# Patient Record
Sex: Female | Born: 1975 | Race: White | Hispanic: No | Marital: Single | Smoking: Former smoker
Health system: Southern US, Community
[De-identification: ages and names within clinical notes are randomized; demographics above are authoritative.]

## PROBLEM LIST (undated history)

## (undated) DIAGNOSIS — IMO0001 Reserved for inherently not codable concepts without codable children: Secondary | ICD-10-CM

## (undated) DIAGNOSIS — N870 Mild cervical dysplasia: Secondary | ICD-10-CM

## (undated) DIAGNOSIS — B977 Papillomavirus as the cause of diseases classified elsewhere: Secondary | ICD-10-CM

## (undated) HISTORY — DX: Mild cervical dysplasia: N87.0

## (undated) HISTORY — DX: Papillomavirus as the cause of diseases classified elsewhere: B97.7

## (undated) HISTORY — DX: Reserved for inherently not codable concepts without codable children: IMO0001

---
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
## 2003-10-13 ENCOUNTER — Encounter: Admission: RE | Admit: 2003-10-13 | Discharge: 2003-10-13 | Payer: Self-pay | Admitting: Endocrinology

## 2008-07-15 ENCOUNTER — Ambulatory Visit: Payer: Self-pay | Admitting: Obstetrics and Gynecology

## 2008-07-15 ENCOUNTER — Encounter: Payer: Self-pay | Admitting: Obstetrics and Gynecology

## 2008-07-15 ENCOUNTER — Other Ambulatory Visit: Admission: RE | Admit: 2008-07-15 | Discharge: 2008-07-15 | Payer: Self-pay | Admitting: Obstetrics and Gynecology

## 2009-08-29 ENCOUNTER — Ambulatory Visit: Payer: Self-pay | Admitting: Obstetrics and Gynecology

## 2009-08-29 ENCOUNTER — Other Ambulatory Visit: Admission: RE | Admit: 2009-08-29 | Discharge: 2009-08-29 | Payer: Self-pay | Admitting: Obstetrics and Gynecology

## 2009-08-29 ENCOUNTER — Encounter: Payer: Self-pay | Admitting: Obstetrics and Gynecology

## 2010-01-06 ENCOUNTER — Ambulatory Visit: Payer: Self-pay | Admitting: Obstetrics and Gynecology

## 2010-11-14 ENCOUNTER — Other Ambulatory Visit
Admission: RE | Admit: 2010-11-14 | Discharge: 2010-11-14 | Payer: Self-pay | Source: Home / Self Care | Admitting: Obstetrics and Gynecology

## 2010-11-14 ENCOUNTER — Ambulatory Visit
Admission: RE | Admit: 2010-11-14 | Discharge: 2010-11-14 | Payer: Self-pay | Source: Home / Self Care | Attending: Obstetrics and Gynecology | Admitting: Obstetrics and Gynecology

## 2010-12-04 ENCOUNTER — Ambulatory Visit
Admission: RE | Admit: 2010-12-04 | Discharge: 2010-12-04 | Payer: Self-pay | Source: Home / Self Care | Attending: Obstetrics and Gynecology | Admitting: Obstetrics and Gynecology

## 2010-12-04 DIAGNOSIS — IMO0001 Reserved for inherently not codable concepts without codable children: Secondary | ICD-10-CM

## 2010-12-04 HISTORY — DX: Reserved for inherently not codable concepts without codable children: IMO0001

## 2011-01-08 ENCOUNTER — Ambulatory Visit: Payer: Self-pay | Admitting: Obstetrics and Gynecology

## 2011-01-15 ENCOUNTER — Ambulatory Visit (INDEPENDENT_AMBULATORY_CARE_PROVIDER_SITE_OTHER): Payer: Managed Care, Other (non HMO) | Admitting: Obstetrics and Gynecology

## 2011-01-15 DIAGNOSIS — N949 Unspecified condition associated with female genital organs and menstrual cycle: Secondary | ICD-10-CM

## 2011-01-15 DIAGNOSIS — N946 Dysmenorrhea, unspecified: Secondary | ICD-10-CM

## 2011-01-15 DIAGNOSIS — N925 Other specified irregular menstruation: Secondary | ICD-10-CM

## 2011-05-28 ENCOUNTER — Telehealth (INDEPENDENT_AMBULATORY_CARE_PROVIDER_SITE_OTHER): Payer: Managed Care, Other (non HMO) | Admitting: Obstetrics and Gynecology

## 2011-05-28 DIAGNOSIS — L659 Nonscarring hair loss, unspecified: Secondary | ICD-10-CM

## 2011-05-28 NOTE — Telephone Encounter (Addendum)
PATIENT STATES HAIR LOSS FOR 3 MONTHS AND FAMILY HISTORY OF HYPOTHYROID & HYPERTHYROIDISM. REQUESTING THYROID LAB ORDER & ANY OTHER LABS RELATED TO HAIR LOSS.

## 2011-05-29 ENCOUNTER — Other Ambulatory Visit: Payer: Self-pay | Admitting: *Deleted

## 2011-06-01 ENCOUNTER — Telehealth: Payer: Self-pay

## 2011-06-01 NOTE — Telephone Encounter (Signed)
PT NOTIFIED ALL THYROID TESTS NORMAL & FOLLOW UP WITH DERMATOLOGIST PER DR. GOTTSEGEN.

## 2011-06-01 NOTE — Telephone Encounter (Signed)
PER DR. GOTTSEGENS NOTE PT. NOTIFIED ALL THYROID TESTS ARE NORMAL-LEFT INFO ON CELL # [email or web address].

## 2011-12-31 DIAGNOSIS — IMO0001 Reserved for inherently not codable concepts without codable children: Secondary | ICD-10-CM | POA: Insufficient documentation

## 2012-01-08 ENCOUNTER — Encounter: Payer: Managed Care, Other (non HMO) | Admitting: Obstetrics and Gynecology

## 2012-01-24 ENCOUNTER — Encounter: Payer: Self-pay | Admitting: Obstetrics and Gynecology

## 2012-01-24 ENCOUNTER — Other Ambulatory Visit (HOSPITAL_COMMUNITY)
Admission: RE | Admit: 2012-01-24 | Discharge: 2012-01-24 | Disposition: A | Discharge status: Home / Self Care | Payer: Managed Care, Other (non HMO) | Source: Ambulatory Visit | Attending: Obstetrics and Gynecology | Admitting: Obstetrics and Gynecology

## 2012-01-24 ENCOUNTER — Ambulatory Visit (INDEPENDENT_AMBULATORY_CARE_PROVIDER_SITE_OTHER): Payer: Managed Care, Other (non HMO) | Admitting: Obstetrics and Gynecology

## 2012-01-24 VITALS — BP 110/60 | Ht 66.0 in | Wt 145.0 lb

## 2012-01-24 DIAGNOSIS — Z01419 Encounter for gynecological examination (general) (routine) without abnormal findings: Secondary | ICD-10-CM | POA: Insufficient documentation

## 2012-01-24 DIAGNOSIS — N870 Mild cervical dysplasia: Secondary | ICD-10-CM | POA: Insufficient documentation

## 2012-01-24 DIAGNOSIS — B977 Papillomavirus as the cause of diseases classified elsewhere: Secondary | ICD-10-CM | POA: Insufficient documentation

## 2012-01-24 LAB — LIPID PANEL
Cholesterol: 170 mg/dL (ref 0–200)
HDL: 61 mg/dL (ref >39)
LDL Cholesterol: 97 mg/dL (ref 0–99)
Total CHOL/HDL Ratio: 2.8 Ratio
Triglycerides: 60 mg/dL (ref <150)
VLDL: 12 mg/dL (ref 0–40)

## 2012-01-24 NOTE — Progress Notes (Signed)
The patient came to see me today for her annual GYN exam. She is happy with her IUD. She has very light cycles. She is not currently sexually active. She had elevated triglycerides last year and she has been working on diet and exercise. She fasted  today to be rechecked. She is having no pelvic pain.  Physical examination: Kennon Portela present. HEENT within normal limits. Neck: Thyroid not large. No masses. Supraclavicular nodes: not enlarged. Breasts: Examined in both sitting and lying  position. No skin changes and no masses. Abdomen: Soft no guarding rebound or masses or hernia. Pelvic: External: Within normal limits. BUS: Within normal limits. Vaginal:within normal limits. Good estrogen effect. No evidence of cystocele rectocele or enterocele. Cervix: clean. IUD string visible Uterus: Normal size and shape. Adnexa: No masses. Rectovaginal exam: Confirmatory and negative. Extremities: Within normal limits.  Assessment: Normal GYN exam. Elevated triglycerides.  Plan: Lab work done.

## 2012-01-25 ENCOUNTER — Other Ambulatory Visit: Payer: Self-pay | Admitting: *Deleted

## 2012-01-25 DIAGNOSIS — D729 Disorder of white blood cells, unspecified: Secondary | ICD-10-CM

## 2012-01-25 LAB — URINALYSIS W MICROSCOPIC + REFLEX CULTURE
Bacteria, UA: NONE SEEN
Bilirubin Urine: NEGATIVE
Casts: NONE SEEN
Crystals: NONE SEEN
Glucose, UA: NEGATIVE mg/dL
Hgb urine dipstick: NEGATIVE
Ketones, ur: NEGATIVE mg/dL
Leukocytes, UA: NEGATIVE
Nitrite: NEGATIVE
Protein, ur: NEGATIVE mg/dL
Specific Gravity, Urine: 1.007 (ref 1.005–1.030)
Squamous Epithelial / HPF: NONE SEEN
Urobilinogen, UA: 0.2 mg/dL (ref 0.0–1.0)
pH: 7 (ref 5.0–8.0)

## 2012-01-25 LAB — CBC WITH DIFFERENTIAL/PLATELET
Basophils Absolute: 0 10*3/uL (ref 0.0–0.1)
Basophils Relative: 0 % (ref 0–1)
Eosinophils Absolute: 0.2 10*3/uL (ref 0.0–0.7)
Eosinophils Relative: 2 % (ref 0–5)
HCT: 43.3 % (ref 36.0–46.0)
Hemoglobin: 14.4 g/dL (ref 12.0–15.0)
Lymphocytes Relative: 35 % (ref 12–46)
Lymphs Abs: 4 10*3/uL (ref 0.7–4.0)
MCH: 31 pg (ref 26.0–34.0)
MCHC: 33.3 g/dL (ref 30.0–36.0)
MCV: 93.3 fL (ref 78.0–100.0)
Monocytes Absolute: 0.8 10*3/uL (ref 0.1–1.0)
Monocytes Relative: 7 % (ref 3–12)
Neutro Abs: 6.5 10*3/uL (ref 1.7–7.7)
Neutrophils Relative %: 56 % (ref 43–77)
Platelets: 324 10*3/uL (ref 150–400)
RBC: 4.64 MIL/uL (ref 3.87–5.11)
RDW: 13.4 % (ref 11.5–15.5)
WBC: 11.5 10*3/uL — ABNORMAL HIGH (ref 4.0–10.5)

## 2012-03-11 ENCOUNTER — Other Ambulatory Visit: Payer: Managed Care, Other (non HMO)

## 2012-03-14 ENCOUNTER — Other Ambulatory Visit: Payer: Managed Care, Other (non HMO)

## 2013-03-06 ENCOUNTER — Ambulatory Visit (INDEPENDENT_AMBULATORY_CARE_PROVIDER_SITE_OTHER): Payer: Managed Care, Other (non HMO) | Admitting: Family

## 2013-03-06 ENCOUNTER — Encounter: Payer: Self-pay | Admitting: Family

## 2013-03-06 VITALS — BP 110/80 | HR 60 | Temp 97.8°F | Resp 16 | Ht 66.75 in | Wt 150.1 lb

## 2013-03-06 DIAGNOSIS — R195 Other fecal abnormalities: Secondary | ICD-10-CM

## 2013-03-06 DIAGNOSIS — Z8742 Personal history of other diseases of the female genital tract: Secondary | ICD-10-CM

## 2013-03-06 DIAGNOSIS — Z87898 Personal history of other specified conditions: Secondary | ICD-10-CM

## 2013-03-06 DIAGNOSIS — N870 Mild cervical dysplasia: Secondary | ICD-10-CM

## 2013-03-06 NOTE — Assessment & Plan Note (Signed)
Hx of abnormal pap. Her last GYN retired, she requests referral to GYN in [place].

## 2013-03-06 NOTE — Patient Instructions (Addendum)
Please schedule a physical at the front desk. Come fasting to this appointment. You will be contacted about your referral to GYN.  Please let us know if you have not heard back within 1 week about your referral. Welcome to [facility]!

## 2013-03-06 NOTE — Progress Notes (Signed)
Subjective:    Patient ID: Nancy Becker, female    DOB: Aug 04, 1976, 37 y.o.   MRN: [id number]  HPI   [name] is a 37 yr old female who presents today to establish care.  She has two concerns today.  1) Neck fullness- notes that the right side of her posterior neck is protruding more than the left for the last 3 months.  2) Floating stool- reports that for the last 6-8 weeks ago month her stools have been "floating." Denies black, bloody or greasy stools. Denies fever or abdominal pain. She has been trying to lose weight recently. She has changed her diet x 1 month.  3) Requesting referral to GYN- has Hx of HPV and CIN 1 pap- pt reports that this was about 4 years ago.  She has an IUD which is 37 years old.     Review of Systems  Constitutional: Negative for unexpected weight change.  HENT: Negative for congestion.   Eyes: Negative for visual disturbance.  Respiratory: Negative for cough.   Cardiovascular: Negative for leg swelling.  Gastrointestinal: Negative for vomiting, diarrhea, constipation and blood in stool.  Genitourinary: Negative for dysuria and frequency.  Musculoskeletal: Negative for myalgias and arthralgias.  Skin: Negative for rash.  Neurological: Negative for headaches.  Hematological: Negative for adenopathy.  Psychiatric/Behavioral:       Denies depression/anxiety       Past Medical History  Diagnosis Date  . IUD 12/04/2010    Mirena inserted.  Marland Kitchen HPV (human papilloma virus) infection   . CIN I (cervical intraepithelial neoplasia I)     History   Social History  . Marital Status: Single    Spouse Name: N/A    Number of Children: 0  . Years of Education: N/A   Occupational History  . SOFTWARE DEVELOPER    Social History Main Topics  . Smoking status: Former Smoker -- 0.50 packs/day for 20 years    Types: Cigarettes    Quit date: 09/06/2012  . Smokeless tobacco: Never Used  . Alcohol Use: 1.0 oz/week    2 drink(s) per week   Comment: occasional  . Drug Use: No  . Sexually Active: Yes    Birth Control/ Protection: IUD     Comment: Mirena inserted 12-04-10   Other Topics Concern  . Not on file   Social History Narrative   Divorced   No children   Works as a Systems analyst   No pets   Enjoys running, knitting, sewing   Quit smoking 2013    Past Surgical History  Procedure Laterality Date  . Tympanoplasty  1987    Family History  Problem Relation Age of Onset  . Hypertension Father   . Heart disease Maternal Grandmother   . Heart disease Maternal Grandfather     heart attack  . Hyperthyroidism Maternal Grandfather   . Hypothyroidism Mother   . Hyperthyroidism Maternal Aunt   . Cancer Paternal Grandfather     lung    No Known Allergies  Current Outpatient Prescriptions on File Prior to Visit  Medication Sig Dispense Refill  . levonorgestrel (MIRENA) 20 MCG/24HR IUD 1 each by Intrauterine route once. Inserted 12/04/10.       No current facility-administered medications on file prior to visit.    BP 110/80  Pulse 60  Temp(Src) 97.8 F (36.6 C) (Oral)  Resp 16  Ht 5' 6.75" (1.695 m)  Wt 150 lb 1.9 oz (68.094 kg)  BMI 23.7 kg/m2  SpO2  99%    Objective:   Physical Exam  Constitutional: She is oriented to person, place, and time. She appears well-developed and well-nourished. No distress.  HENT:  Head: Normocephalic and atraumatic.  Neck: No thyromegaly present.  Cardiovascular: Normal rate and regular rhythm.   No murmur heard. Pulmonary/Chest: Effort normal and breath sounds normal. No respiratory distress. She has no wheezes. She has no rales. She exhibits no tenderness.  Abdominal: Soft. Bowel sounds are normal. She exhibits no distension and no mass. There is no tenderness. There is no rebound and no guarding.  Musculoskeletal: She exhibits no edema.  No neck asymmetry noted anterior or posteriorly. No tenderness or mass.   Lymphadenopathy:    She has no cervical  adenopathy.  Neurological: She is alert and oriented to person, place, and time.  Psychiatric: She has a normal mood and affect. Her behavior is normal. Judgment and thought content normal.          Assessment & Plan:  "Neck Fullness" - not appreciated on exam, reassurance provided,  Could have been due to muscle spasm?

## 2013-03-06 NOTE — Assessment & Plan Note (Signed)
Pt with "floating stools" in the absence of any other sign of infection or abnormal fat content.  Reassurance provided that this is likely dietary related.

## 2013-04-02 ENCOUNTER — Ambulatory Visit: Payer: Managed Care, Other (non HMO) | Admitting: Family Medicine

## 2013-04-09 LAB — HM PAP SMEAR: HM Pap smear: NORMAL

## 2013-04-10 ENCOUNTER — Encounter: Payer: Self-pay | Admitting: Family

## 2013-04-10 ENCOUNTER — Ambulatory Visit (INDEPENDENT_AMBULATORY_CARE_PROVIDER_SITE_OTHER): Payer: Managed Care, Other (non HMO) | Admitting: Family

## 2013-04-10 VITALS — BP 100/70 | HR 56 | Temp 97.8°F | Resp 16 | Ht 66.5 in | Wt 148.1 lb

## 2013-04-10 DIAGNOSIS — Z23 Encounter for immunization: Secondary | ICD-10-CM

## 2013-04-10 DIAGNOSIS — R195 Other fecal abnormalities: Secondary | ICD-10-CM

## 2013-04-10 DIAGNOSIS — Z0000 Encounter for general adult medical examination without abnormal findings: Secondary | ICD-10-CM

## 2013-04-10 LAB — CBC WITH DIFFERENTIAL/PLATELET
Basophils Absolute: 0.1 10*3/uL (ref 0.0–0.1)
Basophils Relative: 1 % (ref 0–1)
Eosinophils Absolute: 0.2 10*3/uL (ref 0.0–0.7)
Eosinophils Relative: 2 % (ref 0–5)
HCT: 40.7 % (ref 36.0–46.0)
Hemoglobin: 13.6 g/dL (ref 12.0–15.0)
Lymphocytes Relative: 37 % (ref 12–46)
Lymphs Abs: 3.1 10*3/uL (ref 0.7–4.0)
MCH: 30.6 pg (ref 26.0–34.0)
MCHC: 33.4 g/dL (ref 30.0–36.0)
MCV: 91.7 fL (ref 78.0–100.0)
Monocytes Absolute: 0.7 10*3/uL (ref 0.1–1.0)
Monocytes Relative: 9 % (ref 3–12)
Neutro Abs: 4.3 10*3/uL (ref 1.7–7.7)
Neutrophils Relative %: 51 % (ref 43–77)
Platelets: 306 10*3/uL (ref 150–400)
RBC: 4.44 MIL/uL (ref 3.87–5.11)
RDW: 14.2 % (ref 11.5–15.5)
WBC: 8.4 10*3/uL (ref 4.0–10.5)

## 2013-04-10 LAB — HEPATIC FUNCTION PANEL
ALT: 22 U/L (ref 0–35)
AST: 20 U/L (ref 0–37)
Albumin: 4.7 g/dL (ref 3.5–5.2)
Alkaline Phosphatase: 36 U/L — ABNORMAL LOW (ref 39–117)
Bilirubin, Direct: 0.2 mg/dL (ref 0.0–0.3)
Indirect Bilirubin: 0.5 mg/dL (ref 0.0–0.9)
Total Bilirubin: 0.7 mg/dL (ref 0.3–1.2)
Total Protein: 6.8 g/dL (ref 6.0–8.3)

## 2013-04-10 LAB — BASIC METABOLIC PANEL WITH GFR
BUN: 13 mg/dL (ref 6–23)
CO2: 27 mEq/L (ref 19–32)
Calcium: 9.6 mg/dL (ref 8.4–10.5)
Chloride: 105 mEq/L (ref 96–112)
Creat: 0.68 mg/dL (ref 0.50–1.10)
GFR, Est African American: >89 mL/min
GFR, Est Non African American: >89 mL/min
Glucose, Bld: 92 mg/dL (ref 70–99)
Potassium: 4.3 mEq/L (ref 3.5–5.3)
Sodium: 139 mEq/L (ref 135–145)

## 2013-04-10 LAB — LIPID PANEL
Cholesterol: 161 mg/dL (ref 0–200)
HDL: 57 mg/dL (ref >39)
LDL Cholesterol: 93 mg/dL (ref 0–99)
Total CHOL/HDL Ratio: 2.8 Ratio
Triglycerides: 54 mg/dL (ref <150)
VLDL: 11 mg/dL (ref 0–40)

## 2013-04-10 LAB — TSH: TSH: 0.871 u[IU]/mL (ref 0.350–4.500)

## 2013-04-10 NOTE — Progress Notes (Signed)
Subjective:    Patient ID: Nancy Becker, female    DOB: Apr 28, 1976, 37 y.o.   MRN: [id number]  HPI  Nancy Becker is a 37 yr old female who presents today for cpx.  Patient presents today for complete physical.  Immunizations: tetanus due Diet: healthy diet Exercise: runs/walks 3-5 days a week sometimes 2 x a day.   Pap Smear: up to date  Floating stools- she continues to be concerned about this and would like further evaluation.  Review of Systems  Constitutional: Negative for unexpected weight change.  HENT: Negative for hearing loss and congestion.   Eyes: Negative for visual disturbance.  Respiratory: Negative for cough.   Cardiovascular: Negative for chest pain.  Gastrointestinal: Negative for nausea, vomiting and diarrhea.  Genitourinary: Negative for menstrual problem.  Musculoskeletal: Negative for myalgias and arthralgias.  Skin: Negative for rash.  Neurological: Negative for headaches.  Hematological: Negative for adenopathy.  Psychiatric/Behavioral:       Denies depression/anxiety   Past Medical History  Diagnosis Date  . IUD 12/04/2010    Mirena inserted.  Marland Kitchen HPV (human papilloma virus) infection   . CIN I (cervical intraepithelial neoplasia I)     History   Social History  . Marital Status: Single    Spouse Name: N/A    Number of Children: 0  . Years of Education: N/A   Occupational History  . SOFTWARE DEVELOPER    Social History Main Topics  . Smoking status: Former Smoker -- 0.50 packs/day for 20 years    Types: Cigarettes    Quit date: 09/06/2012  . Smokeless tobacco: Never Used  . Alcohol Use: 1.0 oz/week    2 drink(s) per week     Comment: occasional  . Drug Use: No  . Sexually Active: Yes    Birth Control/ Protection: IUD     Comment: Mirena inserted 12-04-10   Other Topics Concern  . Not on file   Social History Narrative   Divorced   No children   Works as a Systems analyst   No pets   Enjoys running, knitting,  sewing   Quit smoking 2013    Past Surgical History  Procedure Laterality Date  . Tympanoplasty  1987    Family History  Problem Relation Age of Onset  . Hypertension Father   . Heart disease Maternal Grandmother   . Heart disease Maternal Grandfather     heart attack  . Hyperthyroidism Maternal Grandfather   . Other Maternal Grandfather     Vitamin B deficiency  . Hypothyroidism Mother   . Other Mother     vitamin B deficiency  . Hyperthyroidism Maternal Aunt   . Cancer Paternal Grandfather     lung    No Known Allergies  Current Outpatient Prescriptions on File Prior to Visit  Medication Sig Dispense Refill  . levonorgestrel (MIRENA) 20 MCG/24HR IUD 1 each by Intrauterine route once. Inserted 12/04/10.       No current facility-administered medications on file prior to visit.    BP 100/70  Pulse 56  Temp(Src) 97.8 F (36.6 C) (Oral)  Resp 16  Ht 5' 6.5" (1.689 m)  Wt 148 lb 1.3 oz (67.169 kg)  BMI 23.55 kg/m2  SpO2 99%       Objective:   Physical Exam  Physical Exam  Constitutional: She is oriented to person, place, and time. She appears well-developed and well-nourished. No distress.  HENT:  Head: Normocephalic and atraumatic.  Right Ear: Tympanic membrane and  ear canal normal.  Left Ear: Tympanic membrane and ear canal normal.  Mouth/Throat: Oropharynx is clear and moist.  Eyes: Pupils are equal, round, and reactive to light. No scleral icterus.  Neck: Normal range of motion. No thyromegaly present.  Cardiovascular: Normal rate and regular rhythm.   No murmur heard. Pulmonary/Chest: Effort normal and breath sounds normal. No respiratory distress. He has no wheezes. She has no rales. She exhibits no tenderness.  Abdominal: Soft. Bowel sounds are normal. He exhibits no distension and no mass. There is no tenderness. There is no rebound and no guarding.  Musculoskeletal: She exhibits no edema.  Lymphadenopathy:    She has no cervical adenopathy.   Neurological: She is alert and oriented to person, place, and time.  She exhibits normal muscle tone. Coordination normal.  Skin: Skin is warm and dry.  Psychiatric: She has a normal mood and affect. Her behavior is normal. Judgment and thought content normal.  Breast/pelvic- deferred to GYN          Assessment & Plan:         Assessment & Plan:

## 2013-04-10 NOTE — Assessment & Plan Note (Signed)
She would like further evaluation, refer to GI.

## 2013-04-10 NOTE — Assessment & Plan Note (Signed)
Continue healthy diet, exercise.  Tdap today. She will be travelling to [place] in the fall. We discussed twinrix- she will think about it.

## 2013-04-10 NOTE — Patient Instructions (Addendum)
Please complete your lab work prior to leaving. Follow up in 1 year, sooner if problems/concerns.  

## 2013-04-11 LAB — URINALYSIS, ROUTINE W REFLEX MICROSCOPIC
Bilirubin Urine: NEGATIVE
Glucose, UA: NEGATIVE mg/dL
Hgb urine dipstick: NEGATIVE
Ketones, ur: NEGATIVE mg/dL
Leukocytes, UA: NEGATIVE
Nitrite: NEGATIVE
Protein, ur: NEGATIVE mg/dL
Specific Gravity, Urine: 1.005 (ref 1.005–1.030)
Urobilinogen, UA: 0.2 mg/dL (ref 0.0–1.0)
pH: 7 (ref 5.0–8.0)

## 2013-04-12 ENCOUNTER — Encounter: Payer: Self-pay | Admitting: Family

## 2013-09-10 ENCOUNTER — Other Ambulatory Visit: Payer: Self-pay

## 2014-04-12 ENCOUNTER — Ambulatory Visit (INDEPENDENT_AMBULATORY_CARE_PROVIDER_SITE_OTHER): Payer: Managed Care, Other (non HMO) | Admitting: Family

## 2014-04-12 ENCOUNTER — Encounter: Payer: Self-pay | Admitting: Family

## 2014-04-12 VITALS — BP 118/78 | HR 50 | Temp 97.7°F | Resp 16 | Ht 66.0 in | Wt 159.1 lb

## 2014-04-12 DIAGNOSIS — Z0000 Encounter for general adult medical examination without abnormal findings: Secondary | ICD-10-CM

## 2014-04-12 LAB — LIPID PANEL
Cholesterol: 173 mg/dL (ref 0–200)
HDL: 66 mg/dL (ref >39)
LDL Cholesterol: 96 mg/dL (ref 0–99)
Total CHOL/HDL Ratio: 2.6 Ratio
Triglycerides: 56 mg/dL (ref <150)
VLDL: 11 mg/dL (ref 0–40)

## 2014-04-12 LAB — HEPATIC FUNCTION PANEL
ALT: 15 U/L (ref 0–35)
AST: 17 U/L (ref 0–37)
Albumin: 4.7 g/dL (ref 3.5–5.2)
Alkaline Phosphatase: 48 U/L (ref 39–117)
Bilirubin, Direct: 0.2 mg/dL (ref 0.0–0.3)
Indirect Bilirubin: 0.6 mg/dL (ref 0.2–1.2)
Total Bilirubin: 0.8 mg/dL (ref 0.2–1.2)
Total Protein: 7 g/dL (ref 6.0–8.3)

## 2014-04-12 LAB — BASIC METABOLIC PANEL WITH GFR
BUN: 10 mg/dL (ref 6–23)
CO2: 27 mEq/L (ref 19–32)
Calcium: 9.6 mg/dL (ref 8.4–10.5)
Chloride: 103 mEq/L (ref 96–112)
Creat: 0.64 mg/dL (ref 0.50–1.10)
GFR, Est African American: >89 mL/min
GFR, Est Non African American: >89 mL/min
Glucose, Bld: 99 mg/dL (ref 70–99)
Potassium: 4.6 mEq/L (ref 3.5–5.3)
Sodium: 139 mEq/L (ref 135–145)

## 2014-04-12 LAB — CBC WITH DIFFERENTIAL/PLATELET
Basophils Absolute: 0 10*3/uL (ref 0.0–0.1)
Basophils Relative: 0 % (ref 0–1)
Eosinophils Absolute: 0.2 10*3/uL (ref 0.0–0.7)
Eosinophils Relative: 2 % (ref 0–5)
HCT: 42.1 % (ref 36.0–46.0)
Hemoglobin: 14.3 g/dL (ref 12.0–15.0)
Lymphocytes Relative: 29 % (ref 12–46)
Lymphs Abs: 2.5 10*3/uL (ref 0.7–4.0)
MCH: 31.2 pg (ref 26.0–34.0)
MCHC: 34 g/dL (ref 30.0–36.0)
MCV: 91.7 fL (ref 78.0–100.0)
Monocytes Absolute: 0.5 10*3/uL (ref 0.1–1.0)
Monocytes Relative: 6 % (ref 3–12)
Neutro Abs: 5.5 10*3/uL (ref 1.7–7.7)
Neutrophils Relative %: 63 % (ref 43–77)
Platelets: 300 10*3/uL (ref 150–400)
RBC: 4.59 MIL/uL (ref 3.87–5.11)
RDW: 13.7 % (ref 11.5–15.5)
WBC: 8.7 10*3/uL (ref 4.0–10.5)

## 2014-04-12 LAB — TSH: TSH: 1.375 u[IU]/mL (ref 0.350–4.500)

## 2014-04-12 NOTE — Progress Notes (Signed)
Pre visit review using our clinic review tool, if applicable. No additional management support is needed unless otherwise documented below in the visit note. 

## 2014-04-12 NOTE — Assessment & Plan Note (Signed)
Continue healthy diet, regular exercise, obtain routine lab work.  Follow up with GYN as noted.

## 2014-04-12 NOTE — Patient Instructions (Signed)
Follow up in one year for complete physical.

## 2014-04-12 NOTE — Progress Notes (Signed)
Subjective:    Patient ID: Nancy Becker, female    DOB: [birth year], 38 y.o.   MRN: [id number]  HPI   Nancy Becker is here for a CPE. She voices no current complaints. Immunizations: up to date Diet: healthy- see below.  Exercise:regular- see below    Review of Systems  Constitutional: Positive for fatigue (Feels it is related to running.). Negative for fever, chills, diaphoresis, activity change and appetite change.       Training for half marathon. Runs 5 days per week. Seeing nutritionist and follows recommendations.  HENT: Negative for congestion, hearing loss and rhinorrhea.   Eyes: Negative for visual disturbance.  Respiratory: Negative for cough and shortness of breath.   Cardiovascular: Negative for chest pain, palpitations and leg swelling.  Gastrointestinal: Negative for abdominal pain, diarrhea, constipation and blood in stool.  Endocrine: Negative for cold intolerance, heat intolerance, polydipsia, polyphagia and polyuria.  Genitourinary: Negative for vaginal discharge and difficulty urinating.  Musculoskeletal: Negative for arthralgias.  Skin: Negative for rash.  Neurological: Negative for headaches.  Hematological: Negative for adenopathy. Does not bruise/bleed easily.  Psychiatric/Behavioral: Negative for sleep disturbance.       Denies depression/anxiety.   Will make annual gyn appt soon.  Past Medical History  Diagnosis Date  . IUD 12/04/2010    Mirena inserted.  Marland Kitchen HPV (human papilloma virus) infection   . CIN I (cervical intraepithelial neoplasia I)     History   Social History  . Marital Status: Single    Spouse Name: N/A    Number of Children: 0  . Years of Education: N/A   Occupational History  . SOFTWARE DEVELOPER    Social History Main Topics  . Smoking status: Former Smoker -- 0.50 packs/day for 20 years    Types: Cigarettes    Quit date: 09/06/2012  . Smokeless tobacco: Never Used  . Alcohol Use: 1.0 oz/week    2 drink(s) per  week     Comment: occasional  . Drug Use: No  . Sexual Activity: Yes    Birth Control/ Protection: IUD     Comment: Mirena inserted 12-04-10   Other Topics Concern  . Not on file   Social History Narrative   Divorced   No children   Works as a Systems analyst   No pets   Enjoys running, knitting, sewing   Quit smoking 2013    Past Surgical History  Procedure Laterality Date  . Tympanoplasty  1987    Family History  Problem Relation Age of Onset  . Hypertension Father   . Heart disease Maternal Grandmother   . Heart disease Maternal Grandfather     heart attack  . Hyperthyroidism Maternal Grandfather   . Other Maternal Grandfather     Vitamin B deficiency  . Hypothyroidism Mother   . Other Mother     vitamin B deficiency  . Hyperthyroidism Maternal Aunt   . Cancer Paternal Grandfather     lung    No Known Allergies  Current Outpatient Prescriptions on File Prior to Visit  Medication Sig Dispense Refill  . levonorgestrel (MIRENA) 20 MCG/24HR IUD 1 each by Intrauterine route once. Inserted 12/04/10.       No current facility-administered medications on file prior to visit.    BP 118/78  Pulse 50  Temp(Src) 97.7 F (36.5 C) (Oral)  Resp 16  Ht 5\' 6"  (1.676 m)  Wt 159 lb 1.3 oz (72.158 kg)  BMI 25.69 kg/m2  SpO2 99%  Objective:   Physical Exam  Constitutional: She is oriented to person, place, and time. She appears well-developed and well-nourished. No distress.  HENT:  Head: Normocephalic and atraumatic.  Mouth/Throat: Oropharynx is clear and moist. No oropharyngeal exudate.  Bil TMs pearly white.  Eyes: Conjunctivae and EOM are normal. Pupils are equal, round, and reactive to light. Right eye exhibits no discharge. Left eye exhibits no discharge. No scleral icterus.  Neck: Normal range of motion. Neck supple. No JVD present. No tracheal deviation present. No thyromegaly present.  Cardiovascular: Normal rate, regular rhythm, normal heart  sounds and intact distal pulses.   No murmur heard. Pulmonary/Chest: Effort normal and breath sounds normal. No respiratory distress.  Abdominal: Soft. Bowel sounds are normal. She exhibits no distension and no mass. There is no tenderness.  Genitourinary:  Breast and pelvic deferred to gynecology.  Musculoskeletal: Normal range of motion. She exhibits no edema and no tenderness.  Lymphadenopathy:    She has no cervical adenopathy (no axillary adenopathy.).  Neurological: She is alert and oriented to person, place, and time. She has normal reflexes. She displays normal reflexes. No cranial nerve deficit. She exhibits normal muscle tone. Coordination normal.  Skin: Skin is warm and dry. She is not diaphoretic.  Psychiatric: She has a normal mood and affect. Her behavior is normal.          Assessment & Plan:  [facility] NP-student.  I have personally seen and examined patient and agree with Ms. Whitmire's assessment and plan.

## 2014-04-13 ENCOUNTER — Encounter: Payer: Self-pay | Admitting: Family

## 2014-04-13 LAB — URINALYSIS, ROUTINE W REFLEX MICROSCOPIC
Bilirubin Urine: NEGATIVE
Glucose, UA: NEGATIVE mg/dL
Hgb urine dipstick: NEGATIVE
Ketones, ur: NEGATIVE mg/dL
Leukocytes, UA: NEGATIVE
Nitrite: NEGATIVE
Protein, ur: NEGATIVE mg/dL
Specific Gravity, Urine: 1.006 (ref 1.005–1.030)
Urobilinogen, UA: 0.2 mg/dL (ref 0.0–1.0)
pH: 7 (ref 5.0–8.0)

## 2015-03-23 ENCOUNTER — Telehealth: Payer: Self-pay | Admitting: Family

## 2015-03-23 NOTE — Telephone Encounter (Signed)
Pre Visit letter sent  °

## 2015-04-15 ENCOUNTER — Encounter: Payer: Managed Care, Other (non HMO) | Admitting: Family

## 2015-05-10 ENCOUNTER — Encounter: Payer: Managed Care, Other (non HMO) | Admitting: Family

## 2015-05-20 ENCOUNTER — Encounter: Payer: Self-pay | Admitting: Behavioral Health

## 2015-05-20 ENCOUNTER — Telehealth: Payer: Self-pay | Admitting: Behavioral Health

## 2015-05-20 NOTE — Telephone Encounter (Signed)
Pre-Visit Call completed with patient and chart updated.   Pre-Visit Info documented in Specialty Comments under [employer].    

## 2015-05-23 ENCOUNTER — Ambulatory Visit (INDEPENDENT_AMBULATORY_CARE_PROVIDER_SITE_OTHER): Payer: BLUE CROSS/BLUE SHIELD | Admitting: Family

## 2015-05-23 ENCOUNTER — Encounter: Payer: Self-pay | Admitting: Family

## 2015-05-23 VITALS — BP 118/76 | HR 63 | Temp 98.1°F | Resp 16 | Ht 66.0 in | Wt 158.6 lb

## 2015-05-23 DIAGNOSIS — Z0000 Encounter for general adult medical examination without abnormal findings: Secondary | ICD-10-CM | POA: Diagnosis not present

## 2015-05-23 LAB — URINALYSIS, ROUTINE W REFLEX MICROSCOPIC
Bilirubin Urine: NEGATIVE
Hgb urine dipstick: NEGATIVE
Ketones, ur: NEGATIVE
Nitrite: NEGATIVE
Specific Gravity, Urine: 1.01 (ref 1.000–1.030)
Total Protein, Urine: NEGATIVE
Urine Glucose: NEGATIVE
Urobilinogen, UA: 0.2 (ref 0.0–1.0)
pH: 7 (ref 5.0–8.0)

## 2015-05-23 LAB — LIPID PANEL
Cholesterol: 167 mg/dL (ref 0–200)
HDL: 75.1 mg/dL (ref >39.00)
LDL Cholesterol: 83 mg/dL (ref 0–99)
NonHDL: 91.9
Total CHOL/HDL Ratio: 2
Triglycerides: 44 mg/dL (ref 0.0–149.0)
VLDL: 8.8 mg/dL (ref 0.0–40.0)

## 2015-05-23 LAB — CBC WITH DIFFERENTIAL/PLATELET
Basophils Absolute: 0 10*3/uL (ref 0.0–0.1)
Basophils Relative: 0.5 % (ref 0.0–3.0)
Eosinophils Absolute: 0.2 10*3/uL (ref 0.0–0.7)
Eosinophils Relative: 2.1 % (ref 0.0–5.0)
HCT: 41.5 % (ref 36.0–46.0)
Hemoglobin: 14 g/dL (ref 12.0–15.0)
Lymphocytes Relative: 26.3 % (ref 12.0–46.0)
Lymphs Abs: 2.2 10*3/uL (ref 0.7–4.0)
MCHC: 33.8 g/dL (ref 30.0–36.0)
MCV: 93.7 fl (ref 78.0–100.0)
Monocytes Absolute: 0.7 10*3/uL (ref 0.1–1.0)
Monocytes Relative: 8.5 % (ref 3.0–12.0)
Neutro Abs: 5.2 10*3/uL (ref 1.4–7.7)
Neutrophils Relative %: 62.6 % (ref 43.0–77.0)
Platelets: 275 10*3/uL (ref 150.0–400.0)
RBC: 4.43 Mil/uL (ref 3.87–5.11)
RDW: 14.3 % (ref 11.5–15.5)
WBC: 8.2 10*3/uL (ref 4.0–10.5)

## 2015-05-23 LAB — HEPATIC FUNCTION PANEL
ALT: 16 U/L (ref 0–35)
AST: 22 U/L (ref 0–37)
Albumin: 4.4 g/dL (ref 3.5–5.2)
Alkaline Phosphatase: 35 U/L — ABNORMAL LOW (ref 39–117)
Bilirubin, Direct: 0.2 mg/dL (ref 0.0–0.3)
Total Bilirubin: 0.8 mg/dL (ref 0.2–1.2)
Total Protein: 7.1 g/dL (ref 6.0–8.3)

## 2015-05-23 LAB — BASIC METABOLIC PANEL
BUN: 11 mg/dL (ref 6–23)
CO2: 25 mEq/L (ref 19–32)
Calcium: 9.6 mg/dL (ref 8.4–10.5)
Chloride: 104 mEq/L (ref 96–112)
Creatinine, Ser: 0.66 mg/dL (ref 0.40–1.20)
GFR: 105.79 mL/min (ref >60.00)
Glucose, Bld: 92 mg/dL (ref 70–99)
Potassium: 3.8 mEq/L (ref 3.5–5.1)
Sodium: 139 mEq/L (ref 135–145)

## 2015-05-23 LAB — TSH: TSH: 1.69 u[IU]/mL (ref 0.35–4.50)

## 2015-05-23 LAB — VITAMIN D 25 HYDROXY (VIT D DEFICIENCY, FRACTURES): VITD: 21.26 ng/mL — ABNORMAL LOW (ref 30.00–100.00)

## 2015-05-23 NOTE — Progress Notes (Signed)
Pre visit review using our clinic review tool, if applicable. No additional management support is needed unless otherwise documented below in the visit note. 

## 2015-05-23 NOTE — Patient Instructions (Addendum)
[facility] call [facility] to schedule follow up. [phone] Schedule a routine eye exam at optometrist of your choice.  Complete lab work prior to leaving. Follow up in 1 year, sooner if problems/concerns.

## 2015-05-23 NOTE — Assessment & Plan Note (Signed)
Immunizations reviewed and up to date.  Continue healthy diet, exercise.  Obtain routine labs. Pt advised to arrange follow up with GYN.  Discussed goal BMI <25.

## 2015-05-23 NOTE — Progress Notes (Signed)
Subjective:    Patient ID: Nancy Becker, female    DOB: 07/10/1976, 39 y.o.   MRN: [id number]  HPI  Patient Nancy Becker today for complete physical.  Immunizations: tetanus up to date.  Diet: reports diet is healthy Exercise: reports that she runs about 12-20 miles a week Pap Smear: due for follow up- [facility]:  Up to date Vision- due   Review of Systems  Constitutional: Negative for unexpected weight change.  HENT: Negative for hearing loss and rhinorrhea.   Eyes: Negative for visual disturbance.  Respiratory: Negative for cough.   Cardiovascular: Negative for leg swelling.  Gastrointestinal: Negative for nausea, diarrhea and constipation.  Genitourinary: Negative for dysuria, frequency and menstrual problem.  Musculoskeletal: Negative for myalgias and arthralgias.  Skin: Negative for rash.  Neurological: Negative for headaches.  Hematological: Negative for adenopathy.  Psychiatric/Behavioral: Negative for dysphoric mood and agitation.   Past Medical History  Diagnosis Date  . IUD 12/04/2010    Mirena inserted.  Marland Kitchen. HPV (human papilloma virus) infection   . CIN I (cervical intraepithelial neoplasia I)     History   Social History  . Marital Status: Single    Spouse Name: N/A  . Number of Children: 0  . Years of Education: N/A   Occupational History  . SOFTWARE DEVELOPER    Social History Main Topics  . Smoking status: Former Smoker -- 0.50 packs/day for 20 years    Types: Cigarettes    Quit date: 09/06/2012  . Smokeless tobacco: Never Used  . Alcohol Use: 1.0 oz/week    2 drink(s) per week     Comment: occasional  . Drug Use: No  . Sexual Activity: Yes    Birth Control/ Protection: IUD     Comment: Mirena inserted 12-04-10   Other Topics Concern  . Not on file   Social History Narrative   Divorced   No children   Works as a Systems analystsoftware developer   No pets   Enjoys running, knitting, sewing   Quit smoking 2013    Past Surgical  History  Procedure Laterality Date  . Tympanoplasty  1987    Family History  Problem Relation Age of Onset  . Hypertension Father   . Heart disease Maternal Grandmother   . Heart disease Maternal Grandfather     heart attack  . Hyperthyroidism Maternal Grandfather   . Other Maternal Grandfather     Vitamin B deficiency  . Hypothyroidism Mother   . Other Mother     vitamin B deficiency  . Hyperthyroidism Maternal Aunt   . Cancer Paternal Grandfather     lung    No Known Allergies  Current Outpatient Prescriptions on File Prior to Visit  Medication Sig Dispense Refill  . levonorgestrel (MIRENA) 20 MCG/24HR IUD 1 each by Intrauterine route once. Inserted 12/04/10.     No current facility-administered medications on file prior to visit.    BP 118/76 mmHg  Pulse 63  Temp(Src) 98.1 F (36.7 C) (Oral)  Resp 16  Ht 5\' 6"  (1.676 m)  Wt 158 lb 9.6 oz (71.94 kg)  BMI 25.61 kg/m2  SpO2 99%       Objective:   Physical Exam  Physical Exam  Constitutional: She is oriented to person, place, and time. She appears well-developed and well-nourished. No distress.  HENT:  Head: Normocephalic and atraumatic.  Right Ear: Tympanic membrane and ear canal normal.  Left Ear: Tympanic membrane and ear canal normal.  Mouth/Throat: Oropharynx  is clear and moist.  Eyes: Pupils are equal, round, and reactive to light. No scleral icterus.  Neck: Normal range of motion. No thyromegaly present.  Cardiovascular: Normal rate and regular rhythm.   No murmur heard. Pulmonary/Chest: Effort normal and breath sounds normal. No respiratory distress. He has no wheezes. She has no rales. She exhibits no tenderness.  Abdominal: Soft. Bowel sounds are normal. He exhibits no distension and no mass. There is no tenderness. There is no rebound and no guarding.  Musculoskeletal: She exhibits no edema.  Lymphadenopathy:    She has no cervical adenopathy.  Neurological: She is alert and oriented to person,  place, and time. She has normal patellar reflexes. She exhibits normal muscle tone. Coordination normal.  Skin: Skin is warm and dry.  Psychiatric: She has a normal mood and affect. Her behavior is normal. Judgment and thought content normal.  Breasts: Examined lying Right: Without masses, retractions, discharge or axillary adenopathy.  Left: Without masses, retractions, discharge or axillary adenopathy.        Assessment & Plan:         Assessment & Plan:

## 2015-05-27 ENCOUNTER — Encounter: Payer: Self-pay | Admitting: Family

## 2015-05-27 DIAGNOSIS — E559 Vitamin D deficiency, unspecified: Secondary | ICD-10-CM | POA: Insufficient documentation

## 2015-05-30 ENCOUNTER — Telehealth: Payer: Self-pay | Admitting: *Deleted

## 2015-05-30 DIAGNOSIS — E559 Vitamin D deficiency, unspecified: Secondary | ICD-10-CM

## 2015-05-30 MED ORDER — VITAMIN D (ERGOCALCIFEROL) 1.25 MG (50000 UNIT) PO CAPS: 50000.0000 [IU] | ORAL_CAPSULE | ORAL | Status: DC

## 2015-05-30 NOTE — Telephone Encounter (Signed)
Future lab order entered. See mychart response.

## 2015-05-30 NOTE — Telephone Encounter (Signed)
Left message for pt to check mychart acct. Message sent. 

## 2015-05-30 NOTE — Telephone Encounter (Signed)
-----   Message from Sandford Craze, NP sent at 05/27/2015  6:58 PM EDT ----- Vitamin D level is low.  Advise patient to begin vit D 50000 units once weekly for 12 weeks, then repeat vit D level (dx Vit D deficiency).    UA shows possible UTI,is she having sxs?  If so will rx abx, otherwise no need to treat.  Other labs look good.

## 2015-08-22 ENCOUNTER — Other Ambulatory Visit: Payer: BLUE CROSS/BLUE SHIELD

## 2015-11-15 ENCOUNTER — Encounter: Payer: Self-pay | Admitting: Family

## 2015-11-15 ENCOUNTER — Ambulatory Visit (INDEPENDENT_AMBULATORY_CARE_PROVIDER_SITE_OTHER): Payer: BLUE CROSS/BLUE SHIELD | Admitting: Family

## 2015-11-15 VITALS — BP 114/74 | HR 58 | Temp 98.6°F | Resp 16 | Ht 66.0 in | Wt 170.4 lb

## 2015-11-15 DIAGNOSIS — M79601 Pain in right arm: Secondary | ICD-10-CM | POA: Diagnosis not present

## 2015-11-15 DIAGNOSIS — E559 Vitamin D deficiency, unspecified: Secondary | ICD-10-CM | POA: Diagnosis not present

## 2015-11-15 LAB — VITAMIN D 25 HYDROXY (VIT D DEFICIENCY, FRACTURES): VITD: 28.04 ng/mL — ABNORMAL LOW (ref 30.00–100.00)

## 2015-11-15 MED ORDER — MELOXICAM 7.5 MG PO TABS: 7.5000 mg | ORAL_TABLET | Freq: Every day | ORAL | Status: DC

## 2015-11-15 NOTE — Assessment & Plan Note (Signed)
Obtain follow up vit D level.  

## 2015-11-15 NOTE — Progress Notes (Signed)
Pre visit review using our clinic review tool, if applicable. No additional management support is needed unless otherwise documented below in the visit note. 

## 2015-11-15 NOTE — Progress Notes (Signed)
   Subjective:    Patient ID: Nancy Becker, female    DOB: [birth year], 40 y.o.   MRN: [id number]  HPI  [name] is a 40 yr old female who presents today with chief complaint of right arm pain.  Pain is worse if arm is not supported (such as when typing) or when lifting objects.  Pain has been present x 1 month.  Vit D deficiency- did not complete follow up vit D level.    Review of Systems    see HPI  Past Medical History  Diagnosis Date  . IUD 12/04/2010    Mirena inserted.  Marland Kitchen. HPV (human papilloma virus) infection   . CIN I (cervical intraepithelial neoplasia I)     Social History   Social History  . Marital Status: Single    Spouse Name: N/A  . Number of Children: 0  . Years of Education: N/A   Occupational History  . SOFTWARE DEVELOPER    Social History Main Topics  . Smoking status: Former Smoker -- 0.50 packs/day for 20 years    Types: Cigarettes    Quit date: 09/06/2012  . Smokeless tobacco: Never Used  . Alcohol Use: 1.0 oz/week    2 drink(s) per week     Comment: occasional  . Drug Use: No  . Sexual Activity: Yes    Birth Control/ Protection: IUD     Comment: Mirena inserted 12-04-10   Other Topics Concern  . Not on file   Social History Narrative   Divorced   No children   Works as a Systems analystsoftware developer   No pets   Enjoys running, knitting, sewing   Quit smoking 2013    Past Surgical History  Procedure Laterality Date  . Tympanoplasty  1987    Family History  Problem Relation Age of Onset  . Hypertension Father   . Heart disease Maternal Grandmother   . Heart disease Maternal Grandfather     heart attack  . Hyperthyroidism Maternal Grandfather   . Other Maternal Grandfather     Vitamin B deficiency  . Hypothyroidism Mother   . Other Mother     vitamin B deficiency  . Hyperthyroidism Maternal Aunt   . Cancer Paternal Grandfather     lung    No Known Allergies  Current Outpatient Prescriptions on File Prior to Visit    Medication Sig Dispense Refill  . levonorgestrel (MIRENA) 20 MCG/24HR IUD 1 each by Intrauterine route once. Inserted 12/04/10.     No current facility-administered medications on file prior to visit.    BP 114/74 mmHg  Pulse 58  Temp(Src) 98.6 F (37 C) (Oral)  Resp 16  Ht 5\' 6"  (1.676 m)  Wt 170 lb 6.4 oz (77.293 kg)  BMI 27.52 kg/m2  SpO2 100%    Objective:   Physical Exam  Constitutional: She appears well-developed and well-nourished.  Musculoskeletal:  + pain right forearm with twisting of right wrist.  No swelling.   Psychiatric: She has a normal mood and affect. Her behavior is normal. Judgment and thought content normal.          Assessment & Plan:  Right Arm pain- trial of meloxicam, if no improvement, plan referral to sports medicine.

## 2015-11-15 NOTE — Patient Instructions (Signed)
Start meloxicam for you arm pain.  Let me know if your arm pain worsens or if not improved in 2 weeks. Please complete lab work prior to leaving.

## 2015-11-16 ENCOUNTER — Telehealth: Payer: Self-pay | Admitting: Family

## 2015-11-16 DIAGNOSIS — E559 Vitamin D deficiency, unspecified: Secondary | ICD-10-CM

## 2015-11-16 MED ORDER — VITAMIN D (ERGOCALCIFEROL) 1.25 MG (50000 UNIT) PO CAPS: 50000.0000 [IU] | ORAL_CAPSULE | ORAL | Status: DC

## 2015-11-16 NOTE — Telephone Encounter (Signed)
Notified pt and she voices understanding. Lab appt scheduled for 02/09/16 at 9:15am and future order entered.

## 2015-11-16 NOTE — Telephone Encounter (Signed)
Vitamin D level is low.  Advise patient to begin vit D 50000 units once weekly for 12 weeks, then repeat vit D level (dx Vit D deficiency).     

## 2015-11-29 ENCOUNTER — Encounter: Payer: Self-pay | Admitting: Family

## 2015-12-07 LAB — HM PAP SMEAR

## 2016-01-04 HISTORY — PX: INTRAUTERINE DEVICE INSERTION: SHX323

## 2016-01-26 ENCOUNTER — Encounter: Payer: Self-pay | Admitting: Family

## 2016-02-09 ENCOUNTER — Other Ambulatory Visit (INDEPENDENT_AMBULATORY_CARE_PROVIDER_SITE_OTHER): Payer: Managed Care, Other (non HMO)

## 2016-02-09 ENCOUNTER — Other Ambulatory Visit: Payer: Self-pay | Admitting: Family

## 2016-02-09 ENCOUNTER — Encounter: Payer: Self-pay | Admitting: Family

## 2016-02-09 DIAGNOSIS — E559 Vitamin D deficiency, unspecified: Secondary | ICD-10-CM | POA: Diagnosis not present

## 2016-02-09 LAB — VITAMIN D 25 HYDROXY (VIT D DEFICIENCY, FRACTURES): VITD: 43.08 ng/mL (ref 30.00–100.00)

## 2016-02-09 MED ORDER — VITAMIN D3 75 MCG (3000 UT) PO TABS: 1.0000 | ORAL_TABLET | Freq: Every day | ORAL | Status: DC

## 2016-05-28 ENCOUNTER — Encounter: Payer: Self-pay | Admitting: Family

## 2016-05-28 ENCOUNTER — Ambulatory Visit (INDEPENDENT_AMBULATORY_CARE_PROVIDER_SITE_OTHER): Payer: Managed Care, Other (non HMO) | Admitting: Family

## 2016-05-28 VITALS — BP 110/78 | HR 68 | Temp 98.3°F | Ht 65.75 in | Wt 166.4 lb

## 2016-05-28 DIAGNOSIS — Z0000 Encounter for general adult medical examination without abnormal findings: Secondary | ICD-10-CM | POA: Diagnosis not present

## 2016-05-28 LAB — URINALYSIS, ROUTINE W REFLEX MICROSCOPIC
BILIRUBIN URINE: NEGATIVE
HGB URINE DIPSTICK: NEGATIVE
KETONES UR: NEGATIVE
LEUKOCYTES UA: NEGATIVE
NITRITE: NEGATIVE
RBC / HPF: NONE SEEN (ref 0–?)
Specific Gravity, Urine: <=1.005 — AB (ref 1.000–1.030)
Total Protein, Urine: NEGATIVE
URINE GLUCOSE: NEGATIVE
UROBILINOGEN UA: 0.2 (ref 0.0–1.0)
WBC UA: NONE SEEN (ref 0–?)
pH: 7 (ref 5.0–8.0)

## 2016-05-28 LAB — LIPID PANEL
CHOL/HDL RATIO: 2
CHOLESTEROL: 169 mg/dL (ref 0–200)
HDL: 67.8 mg/dL (ref >39.00)
LDL Cholesterol: 88 mg/dL (ref 0–99)
NonHDL: 101.06
TRIGLYCERIDES: 63 mg/dL (ref 0.0–149.0)
VLDL: 12.6 mg/dL (ref 0.0–40.0)

## 2016-05-28 LAB — CBC WITH DIFFERENTIAL/PLATELET
BASOS PCT: 0.4 % (ref 0.0–3.0)
Basophils Absolute: 0 10*3/uL (ref 0.0–0.1)
EOS ABS: 0.3 10*3/uL (ref 0.0–0.7)
EOS PCT: 3.2 % (ref 0.0–5.0)
HEMATOCRIT: 39 % (ref 36.0–46.0)
HEMOGLOBIN: 13 g/dL (ref 12.0–15.0)
Lymphocytes Relative: 30.1 % (ref 12.0–46.0)
Lymphs Abs: 2.5 10*3/uL (ref 0.7–4.0)
MCHC: 33.5 g/dL (ref 30.0–36.0)
MCV: 93.6 fl (ref 78.0–100.0)
MONO ABS: 0.7 10*3/uL (ref 0.1–1.0)
Monocytes Relative: 7.9 % (ref 3.0–12.0)
NEUTROS ABS: 4.8 10*3/uL (ref 1.4–7.7)
Neutrophils Relative %: 58.4 % (ref 43.0–77.0)
PLATELETS: 269 10*3/uL (ref 150.0–400.0)
RBC: 4.17 Mil/uL (ref 3.87–5.11)
RDW: 14 % (ref 11.5–15.5)
WBC: 8.3 10*3/uL (ref 4.0–10.5)

## 2016-05-28 LAB — BASIC METABOLIC PANEL
BUN: 10 mg/dL (ref 6–23)
CHLORIDE: 104 meq/L (ref 96–112)
CO2: 29 meq/L (ref 19–32)
CREATININE: 0.65 mg/dL (ref 0.40–1.20)
Calcium: 9.5 mg/dL (ref 8.4–10.5)
GFR: 107.11 mL/min (ref >60.00)
Glucose, Bld: 93 mg/dL (ref 70–99)
POTASSIUM: 3.7 meq/L (ref 3.5–5.1)
Sodium: 139 mEq/L (ref 135–145)

## 2016-05-28 LAB — HEPATIC FUNCTION PANEL
ALT: 13 U/L (ref 0–35)
AST: 17 U/L (ref 0–37)
Albumin: 4.3 g/dL (ref 3.5–5.2)
Alkaline Phosphatase: 39 U/L (ref 39–117)
BILIRUBIN DIRECT: 0.2 mg/dL (ref 0.0–0.3)
BILIRUBIN TOTAL: 1.4 mg/dL — AB (ref 0.2–1.2)
TOTAL PROTEIN: 6.9 g/dL (ref 6.0–8.3)

## 2016-05-28 NOTE — Patient Instructions (Signed)
Continue to work on [employer], limiting sugars/alcohol, and exercise. Follow up in 1 year, sooner if problems/concerns.

## 2016-05-28 NOTE — Assessment & Plan Note (Addendum)
Discussed healthy diet/exercise, weight loss.  Goal BMI 25 or less. Immunizations reviewed and up to date.  Will send for mammogram. Pap up to date.

## 2016-05-28 NOTE — Progress Notes (Signed)
Subjective:    Patient ID: Nancy Becker, female    DOB: [birth year], 40 y.o.   MRN: [id number]  HPI  Patient presents today for complete physical.  Immunizations: up to date Diet: could be better Exercise: running Wt Readings from Last 3 Encounters:  05/28/16 166 lb 6.4 oz (75.5 kg)  11/15/15 170 lb 6.4 oz (77.3 kg)  05/23/15 158 lb 9.6 oz (71.9 kg)  Pap Smear: 2/17  Mammogram: due     Review of Systems  Constitutional: Negative for unexpected weight change.  HENT: Negative for hearing loss and rhinorrhea.   Eyes: Negative for visual disturbance.  Respiratory: Negative for cough.   Gastrointestinal: Negative for constipation and diarrhea.  Genitourinary: Negative for dysuria, frequency and menstrual problem.  Musculoskeletal: Negative for arthralgias and myalgias.  Skin: Negative for rash.  Neurological: Negative for headaches.  Hematological: Negative for adenopathy.  Psychiatric/Behavioral:       Denies depression/anxiety       Past Medical History:  Diagnosis Date  . CIN I (cervical intraepithelial neoplasia I)   . HPV (human papilloma virus) infection   . IUD 12/04/2010   Mirena inserted.     Social History   Social History  . Marital status: Single    Spouse name: N/A  . Number of children: 0  . Years of education: N/A   Occupational History  . SOFTWARE DEVELOPER [employer]   Social History Main Topics  . Smoking status: Former Smoker    Packs/day: 0.50    Years: 20.00    Types: Cigarettes    Quit date: 09/06/2012  . Smokeless tobacco: Never Used  . Alcohol use 1.0 oz/week    2 Standard drinks or equivalent per week     Comment: occasional  . Drug use: No  . Sexual activity: Yes    Birth control/ protection: IUD     Comment: Mirena inserted 12-04-10   Other Topics Concern  . Not on file   Social History Narrative   Divorced   No children   Works as a Systems analyst   No pets   Enjoys running, knitting, sewing   Quit  smoking 2013    Past Surgical History:  Procedure Laterality Date  . INTRAUTERINE DEVICE INSERTION  01/04/2016  . TYMPANOPLASTY  1987    Family History  Problem Relation Age of Onset  . Hypertension Father   . Heart disease Maternal Grandmother   . Heart disease Maternal Grandfather     heart attack  . Hyperthyroidism Maternal Grandfather   . Other Maternal Grandfather     Vitamin B deficiency  . Hypothyroidism Mother   . Other Mother     vitamin B deficiency  . Hyperthyroidism Maternal Aunt   . Cancer Paternal Grandfather     lung    No Known Allergies  Current Outpatient Prescriptions on File Prior to Visit  Medication Sig Dispense Refill  . levonorgestrel (MIRENA) 20 MCG/24HR IUD 1 each by Intrauterine route once. Inserted 12/04/10.     No current facility-administered medications on file prior to visit.     BP 110/78   Pulse 68   Temp 98.3 F (36.8 C) (Oral)   Ht 5' 5.75" (1.67 m)   Wt 166 lb 6.4 oz (75.5 kg)   SpO2 98% Comment: room air  BMI 27.06 kg/m    Objective:   Physical Exam  Physical Exam  Constitutional: She is oriented to person, place, and time. She appears well-developed and well-nourished. No  distress.  HENT:  Head: Normocephalic and atraumatic.  Right Ear: Tympanic membrane and ear canal normal.  Left Ear: Tympanic membrane and ear canal normal.  Mouth/Throat: Oropharynx is clear and moist.  Eyes: Pupils are equal, round, and reactive to light. No scleral icterus.  Neck: Normal range of motion. No thyromegaly present.  Cardiovascular: Normal rate and regular rhythm.   No murmur heard. Pulmonary/Chest: Effort normal and breath sounds normal. No respiratory distress. He has no wheezes. She has no rales. She exhibits no tenderness.  Abdominal: Soft. Bowel sounds are normal. He exhibits no distension and no mass. There is no tenderness. There is no rebound and no guarding.  Musculoskeletal: She exhibits no edema.  Lymphadenopathy:    She  has no cervical adenopathy.  Neurological: She is alert and oriented to person, place, and time. She has normal patellar reflexes. She exhibits normal muscle tone. Coordination normal.  Skin: Skin is warm and dry.  Psychiatric: She has a normal mood and affect. Her behavior is normal. Judgment and thought content normal.  Breast/pelvic: deferred to GYN.       Assessment & Plan:          Assessment & Plan:  EKG tracing is personally reviewed.  EKG notes NSR.  No acute changes.

## 2016-05-29 LAB — TSH: TSH: 1.82 u[IU]/mL (ref 0.35–4.50)

## 2016-06-04 ENCOUNTER — Ambulatory Visit (HOSPITAL_BASED_OUTPATIENT_CLINIC_OR_DEPARTMENT_OTHER)
Admission: RE | Admit: 2016-06-04 | Discharge: 2016-06-04 | Disposition: A | Discharge status: Home / Self Care | Payer: Managed Care, Other (non HMO) | Source: Ambulatory Visit | Attending: Family | Admitting: Family

## 2016-06-04 DIAGNOSIS — Z0000 Encounter for general adult medical examination without abnormal findings: Secondary | ICD-10-CM | POA: Diagnosis not present

## 2016-06-04 DIAGNOSIS — Z1231 Encounter for screening mammogram for malignant neoplasm of breast: Secondary | ICD-10-CM | POA: Insufficient documentation

## 2017-05-29 ENCOUNTER — Ambulatory Visit (INDEPENDENT_AMBULATORY_CARE_PROVIDER_SITE_OTHER): Payer: Managed Care, Other (non HMO) | Admitting: Family

## 2017-05-29 ENCOUNTER — Encounter: Payer: Self-pay | Admitting: Family

## 2017-05-29 VITALS — BP 130/67 | HR 54 | Temp 98.2°F | Resp 16 | Ht 66.0 in | Wt 169.0 lb

## 2017-05-29 DIAGNOSIS — Z0000 Encounter for general adult medical examination without abnormal findings: Secondary | ICD-10-CM | POA: Diagnosis not present

## 2017-05-29 DIAGNOSIS — Z0001 Encounter for general adult medical examination with abnormal findings: Secondary | ICD-10-CM

## 2017-05-29 DIAGNOSIS — M25562 Pain in left knee: Secondary | ICD-10-CM

## 2017-05-29 DIAGNOSIS — G8929 Other chronic pain: Secondary | ICD-10-CM | POA: Diagnosis not present

## 2017-05-29 DIAGNOSIS — E559 Vitamin D deficiency, unspecified: Secondary | ICD-10-CM | POA: Diagnosis not present

## 2017-05-29 LAB — URINALYSIS, ROUTINE W REFLEX MICROSCOPIC
BILIRUBIN URINE: NEGATIVE
HGB URINE DIPSTICK: NEGATIVE
Ketones, ur: NEGATIVE
LEUKOCYTES UA: NEGATIVE
NITRITE: NEGATIVE
RBC / HPF: NONE SEEN (ref 0–?)
Specific Gravity, Urine: <=1.005 — AB (ref 1.000–1.030)
TOTAL PROTEIN, URINE-UPE24: NEGATIVE
Urine Glucose: NEGATIVE
Urobilinogen, UA: 0.2 (ref 0.0–1.0)
pH: 7 (ref 5.0–8.0)

## 2017-05-29 LAB — BASIC METABOLIC PANEL
BUN: 10 mg/dL (ref 6–23)
CALCIUM: 9.8 mg/dL (ref 8.4–10.5)
CO2: 29 mEq/L (ref 19–32)
Chloride: 104 mEq/L (ref 96–112)
Creatinine, Ser: 0.76 mg/dL (ref 0.40–1.20)
GFR: 88.98 mL/min (ref >60.00)
GLUCOSE: 102 mg/dL — AB (ref 70–99)
Potassium: 3.9 mEq/L (ref 3.5–5.1)
Sodium: 137 mEq/L (ref 135–145)

## 2017-05-29 LAB — LIPID PANEL
CHOLESTEROL: 176 mg/dL (ref 0–200)
HDL: 73.3 mg/dL (ref >39.00)
LDL CALC: 92 mg/dL (ref 0–99)
NonHDL: 102.36
Total CHOL/HDL Ratio: 2
Triglycerides: 54 mg/dL (ref 0.0–149.0)
VLDL: 10.8 mg/dL (ref 0.0–40.0)

## 2017-05-29 LAB — CBC WITH DIFFERENTIAL/PLATELET
BASOS ABS: 0.1 10*3/uL (ref 0.0–0.1)
Basophils Relative: 0.7 % (ref 0.0–3.0)
Eosinophils Absolute: 0.2 10*3/uL (ref 0.0–0.7)
Eosinophils Relative: 2.7 % (ref 0.0–5.0)
HCT: 42.1 % (ref 36.0–46.0)
Hemoglobin: 13.9 g/dL (ref 12.0–15.0)
LYMPHS ABS: 2.4 10*3/uL (ref 0.7–4.0)
Lymphocytes Relative: 30.8 % (ref 12.0–46.0)
MCHC: 33.1 g/dL (ref 30.0–36.0)
MCV: 96.1 fl (ref 78.0–100.0)
MONO ABS: 0.6 10*3/uL (ref 0.1–1.0)
Monocytes Relative: 7.2 % (ref 3.0–12.0)
NEUTROS ABS: 4.5 10*3/uL (ref 1.4–7.7)
NEUTROS PCT: 58.6 % (ref 43.0–77.0)
PLATELETS: 298 10*3/uL (ref 150.0–400.0)
RBC: 4.38 Mil/uL (ref 3.87–5.11)
RDW: 13.5 % (ref 11.5–15.5)
WBC: 7.7 10*3/uL (ref 4.0–10.5)

## 2017-05-29 LAB — TSH: TSH: 2 u[IU]/mL (ref 0.35–4.50)

## 2017-05-29 LAB — HEPATIC FUNCTION PANEL
ALT: 12 U/L (ref 0–35)
AST: 16 U/L (ref 0–37)
Albumin: 4.4 g/dL (ref 3.5–5.2)
Alkaline Phosphatase: 43 U/L (ref 39–117)
BILIRUBIN TOTAL: 1.1 mg/dL (ref 0.2–1.2)
Bilirubin, Direct: 0.2 mg/dL (ref 0.0–0.3)
Total Protein: 7.4 g/dL (ref 6.0–8.3)

## 2017-05-29 NOTE — Patient Instructions (Signed)
Please complete lab work prior to leaving. Try limiting your calorie intake to 1500-1600/day for purposes of weight loss. Focus on getting plenty of lean protein, and fresh vegetables, some fruits and small portions of carbohydrates.

## 2017-05-29 NOTE — Progress Notes (Signed)
Subjective:    Patient ID: Nancy PicketCorrie E Becker, female    DOB: [birth year], 41 y.o.   MRN: [id number]  HPI  Patient presents today for complete physical.  Immunizations: tdap 6/14 Diet: fair diet Wt Readings from Last 3 Encounters:  05/29/17 169 lb (76.7 kg)  05/28/16 166 lb 6.4 oz (75.5 kg)  11/15/15 170 lb 6.4 oz (77.3 kg)  Exercise: enjoys walking, biking, crossfit Pap Smear: 12/07/15 ([facility]) Mammogram: 06/04/16  L knee pain with kneeing.  Hurts most when she kneels with contralateral knee or when squatting with a lot of weight.    Review of Systems  Constitutional: Negative for unexpected weight change.  HENT: Negative for hearing loss and rhinorrhea.   Eyes: Negative for visual disturbance.  Respiratory: Negative for cough.   Cardiovascular: Negative for leg swelling.  Gastrointestinal: Negative for blood in stool, constipation and diarrhea.  Genitourinary: Negative for dysuria, frequency and hematuria.  Musculoskeletal: Negative for arthralgias and myalgias.  Neurological: Negative for headaches.  Hematological: Negative for adenopathy.  Psychiatric/Behavioral:       Denies depression/anxiety   Past Medical History:  Diagnosis Date  . CIN I (cervical intraepithelial neoplasia I)   . HPV (human papilloma virus) infection   . IUD 12/04/2010   Mirena inserted.     Social History   Social History  . Marital status: Single    Spouse name: N/A  . Number of children: 0  . Years of education: N/A   Occupational History  . SOFTWARE DEVELOPER [employer]   Social History Main Topics  . Smoking status: Former Smoker    Packs/day: 0.50    Years: 20.00    Types: Cigarettes    Quit date: 09/06/2012  . Smokeless tobacco: Never Used  . Alcohol use 1.0 oz/week    2 Standard drinks or equivalent per week     Comment: occasional  . Drug use: No  . Sexual activity: Yes    Birth control/ protection: IUD     Comment: Mirena inserted 12-04-10   Other Topics  Concern  . Not on file   Social History Narrative   Divorced   No children   Works as a Systems analystsoftware developer   No pets   Enjoys running, knitting, sewing   Quit smoking 2013    Past Surgical History:  Procedure Laterality Date  . INTRAUTERINE DEVICE INSERTION  01/04/2016  . TYMPANOPLASTY  1987    Family History  Problem Relation Age of Onset  . Hypertension Father   . Heart disease Maternal Grandmother   . Heart disease Maternal Grandfather        heart attack  . Hyperthyroidism Maternal Grandfather   . Other Maternal Grandfather        Vitamin B deficiency  . Hypothyroidism Mother   . Other Mother        vitamin B deficiency  . Hyperthyroidism Maternal Aunt   . Cancer Paternal Grandfather        lung    No Known Allergies  Current Outpatient Prescriptions on File Prior to Visit  Medication Sig Dispense Refill  . levonorgestrel (MIRENA) 20 MCG/24HR IUD 1 each by Intrauterine route once. Inserted 12/04/10.     No current facility-administered medications on file prior to visit.     BP 130/67 (BP Location: Right Arm, Cuff Size: Normal)   Pulse (!) 54   Temp 98.2 F (36.8 C) (Oral)   Resp 16   Ht 5\' 6"  (1.676 m)  Wt 169 lb (76.7 kg)   SpO2 100%   BMI 27.28 kg/m       Objective:   Physical Exam  Physical Exam  Constitutional: She is oriented to person, place, and time. She appears well-developed and well-nourished. No distress.  HENT:  Head: Normocephalic and atraumatic.  Right Ear: Tympanic membrane and ear canal normal.  Left Ear: Tympanic membrane and ear canal normal.  Mouth/Throat: Oropharynx is clear and moist.  Eyes: Pupils are equal, round, and reactive to light. No scleral icterus.  Neck: Normal range of motion. No thyromegaly present.  Cardiovascular: Normal rate and regular rhythm.   No murmur heard. Pulmonary/Chest: Effort normal and breath sounds normal. No respiratory distress. He has no wheezes. She has no rales. She exhibits no  tenderness.  Abdominal: Soft. Bowel sounds are normal. She exhibits no distension and no mass. There is no tenderness. There is no rebound and no guarding.  Musculoskeletal: She exhibits no edema. left knee without swelling, mild crepitus.  Neg drawer sign Lymphadenopathy:    She has no cervical adenopathy.  Neurological: She is alert and oriented to person, place, and time. She has normal patellar reflexes. She exhibits normal muscle tone. Coordination normal.  Skin: Skin is warm and dry.  Psychiatric: She has a normal mood and affect. Her behavior is normal. Judgment and thought content normal.  Breast/pelvic: deferred.            Assessment & Plan:         Assessment & Plan:  Preventative Care- discussed healthy diet.  EKG tracing is personally reviewed.  EKG notes NSR. No acute changes. We discussed healthy diet, regular exercise.  Left knee pain- will refer to sports medicine for further evaluation.

## 2017-06-01 LAB — VITAMIN D 1,25 DIHYDROXY
Vitamin D 1, 25 (OH)2 Total: 44 pg/mL (ref 18–72)
Vitamin D2 1, 25 (OH)2: <8 pg/mL
Vitamin D3 1, 25 (OH)2: 44 pg/mL

## 2017-06-06 ENCOUNTER — Encounter: Payer: Self-pay | Admitting: Family Medicine

## 2017-06-06 ENCOUNTER — Ambulatory Visit (INDEPENDENT_AMBULATORY_CARE_PROVIDER_SITE_OTHER): Payer: Managed Care, Other (non HMO) | Admitting: Family Medicine

## 2017-06-06 ENCOUNTER — Ambulatory Visit (HOSPITAL_BASED_OUTPATIENT_CLINIC_OR_DEPARTMENT_OTHER)
Admission: RE | Admit: 2017-06-06 | Discharge: 2017-06-06 | Disposition: A | Discharge status: Home / Self Care | Payer: Managed Care, Other (non HMO) | Source: Ambulatory Visit | Attending: Family | Admitting: Family

## 2017-06-06 DIAGNOSIS — M25562 Pain in left knee: Secondary | ICD-10-CM | POA: Diagnosis not present

## 2017-06-06 DIAGNOSIS — Z0000 Encounter for general adult medical examination without abnormal findings: Secondary | ICD-10-CM

## 2017-06-06 DIAGNOSIS — Z1231 Encounter for screening mammogram for malignant neoplasm of breast: Secondary | ICD-10-CM | POA: Diagnosis not present

## 2017-06-06 NOTE — Progress Notes (Signed)
PCP and consultation requested by: Nancy Becker, Melissa, NP  Subjective:   HPI: Patient is a 41 y.o. female here for left knee pain.  Patient reports about 1 month ago she started to get anterior knee pain following a crossfit workout when she jogged with a weighted vest. No acute injury or trauma though. Since then painful to do squats or kneel down. No pain with standing or sitting. Pain gets up to 5/10 and is a discomfort with squatting or kneeling. No prior issues with this knee. Questionable swelling but no bruising. No treatment to date. No skin changes, numbness.  Past Medical History:  Diagnosis Date  . CIN I (cervical intraepithelial neoplasia I)   . HPV (human papilloma virus) infection   . IUD 12/04/2010   Mirena inserted.    Current Outpatient Prescriptions on File Prior to Visit  Medication Sig Dispense Refill  . levonorgestrel (MIRENA) 20 MCG/24HR IUD 1 each by Intrauterine route once. Inserted 12/04/10.     No current facility-administered medications on file prior to visit.     Past Surgical History:  Procedure Laterality Date  . INTRAUTERINE DEVICE INSERTION  01/04/2016  . TYMPANOPLASTY  1987    No Known Allergies  Social History   Social History  . Marital status: Single    Spouse name: N/A  . Number of children: 0  . Years of education: N/A   Occupational History  . SOFTWARE DEVELOPER [employer]   Social History Main Topics  . Smoking status: Former Smoker    Packs/day: 0.50    Years: 20.00    Types: Cigarettes    Quit date: 09/06/2012  . Smokeless tobacco: Never Used  . Alcohol use 1.0 oz/week    2 Standard drinks or equivalent per week     Comment: occasional  . Drug use: No  . Sexual activity: Yes    Birth control/ protection: IUD     Comment: Mirena inserted 12-04-10   Other Topics Concern  . Not on file   Social History Narrative   Divorced   Lives with boyfriend   No children   Works as a Systems analystsoftware developer   No pets   Enjoys running, knitting, sewing, crossfit   Quit smoking 2013    Family History  Problem Relation Age of Onset  . Hypertension Father   . Heart disease Maternal Grandmother   . Heart disease Maternal Grandfather        heart attack  . Hyperthyroidism Maternal Grandfather   . Other Maternal Grandfather        Vitamin B deficiency  . Hypothyroidism Mother   . Other Mother        vitamin B deficiency  . Hyperthyroidism Maternal Aunt   . Cancer Paternal Grandfather        lung    BP 124/79   Pulse 63   Ht 5\' 6"  (1.676 m)   Wt 169 lb (76.7 kg)   BMI 27.28 kg/m   Review of Systems: See HPI above.     Objective:  Physical Exam:  Gen: NAD, comfortable in exam room  Left knee: Mild overpronation. Mild effusion.  Mild VMO atrophy.  No other gross deformity, ecchymoses. No TTP. FROM with patellar shift from flexion to extension. 5/5 strength hip abduction. Negative ant/post drawers. Negative valgus/varus testing. Negative lachmanns. Negative mcmurrays, apleys, thessalys, sit home, patellar apprehension. NV intact distally.  Right knee: FROM without pain.   Assessment & Plan:  1. Left knee pain - consistent with  patellofemoral syndrome and synovitis (synovitis and effusion confirmed by ultrasound).  Rest of exam reassuring.  Shown home exercises to do daily.  Arch supports recommended.  Icing, ibuprofen or aleve.  Avoid flat shoes, barefoot walking as much as possible.  F/u in 6 weeks.

## 2017-06-06 NOTE — Assessment & Plan Note (Signed)
consistent with patellofemoral syndrome and synovitis (synovitis and effusion confirmed by ultrasound).  Rest of exam reassuring.  Shown home exercises to do daily.  Arch supports recommended.  Icing, ibuprofen or aleve.  Avoid flat shoes, barefoot walking as much as possible.  F/u in 6 weeks.

## 2017-06-06 NOTE — Patient Instructions (Signed)
You have patellofemoral syndrome. Avoid painful activities when possible (often deep squats, lunges bother this). Cross train with swimming, cycling with low resistance, elliptical if needed. Straight leg raise, hip side raises, straight leg raises with foot turned outwards 3 sets of 10 once a day. Add ankle weight if these become too easy. Consider formal physical therapy. Correct foot breakdown with something like dr. Jari Sportsmanscholls active series, spencos, superfeet, or our green sports insoles. Avoid flat shoes, barefoot walking as much as possible. Icing 15 minutes at a time 3-4 times a day as needed. Ibuprofen 600mg  three times a day with food OR aleve 2 tabs twice a day with food for pain and inflammation. Follow up with me in 6 weeks.

## 2017-07-18 ENCOUNTER — Ambulatory Visit: Payer: Managed Care, Other (non HMO) | Admitting: Family Medicine

## 2017-09-20 ENCOUNTER — Other Ambulatory Visit: Payer: Self-pay | Admitting: Family

## 2017-09-20 ENCOUNTER — Encounter: Payer: Self-pay | Admitting: Family

## 2017-09-20 MED ORDER — ALPRAZOLAM 0.25 MG PO TABS: ORAL_TABLET | ORAL | 0 refills | Status: DC

## 2017-09-20 NOTE — Progress Notes (Signed)
rx sent

## 2018-03-13 ENCOUNTER — Other Ambulatory Visit: Payer: Self-pay | Admitting: Family

## 2018-03-13 ENCOUNTER — Encounter: Payer: Self-pay | Admitting: Family

## 2018-03-14 ENCOUNTER — Other Ambulatory Visit: Payer: Self-pay | Admitting: Family

## 2018-03-14 MED ORDER — ALPRAZOLAM 0.25 MG PO TABS: ORAL_TABLET | ORAL | 0 refills | Status: DC

## 2018-03-14 NOTE — Telephone Encounter (Signed)
[place] controlled substance registry reviewed.

## 2018-05-21 ENCOUNTER — Other Ambulatory Visit: Payer: Self-pay | Admitting: Family

## 2018-05-21 DIAGNOSIS — Z1231 Encounter for screening mammogram for malignant neoplasm of breast: Secondary | ICD-10-CM

## 2018-06-02 ENCOUNTER — Encounter: Payer: Self-pay | Admitting: Family

## 2018-06-02 ENCOUNTER — Ambulatory Visit (INDEPENDENT_AMBULATORY_CARE_PROVIDER_SITE_OTHER): Payer: Managed Care, Other (non HMO) | Admitting: Family

## 2018-06-02 VITALS — BP 109/69 | HR 54 | Temp 98.0°F | Resp 16 | Ht 66.0 in | Wt 169.0 lb

## 2018-06-02 DIAGNOSIS — Z0000 Encounter for general adult medical examination without abnormal findings: Secondary | ICD-10-CM

## 2018-06-02 LAB — CBC WITH DIFFERENTIAL/PLATELET
BASOS PCT: 0.7 % (ref 0.0–3.0)
Basophils Absolute: 0.1 10*3/uL (ref 0.0–0.1)
EOS ABS: 0.2 10*3/uL (ref 0.0–0.7)
EOS PCT: 2.3 % (ref 0.0–5.0)
HCT: 40.8 % (ref 36.0–46.0)
HEMOGLOBIN: 13.7 g/dL (ref 12.0–15.0)
LYMPHS ABS: 2.8 10*3/uL (ref 0.7–4.0)
Lymphocytes Relative: 35.9 % (ref 12.0–46.0)
MCHC: 33.6 g/dL (ref 30.0–36.0)
MCV: 95.3 fl (ref 78.0–100.0)
MONO ABS: 0.6 10*3/uL (ref 0.1–1.0)
Monocytes Relative: 7.5 % (ref 3.0–12.0)
NEUTROS PCT: 53.6 % (ref 43.0–77.0)
Neutro Abs: 4.2 10*3/uL (ref 1.4–7.7)
PLATELETS: 294 10*3/uL (ref 150.0–400.0)
RBC: 4.29 Mil/uL (ref 3.87–5.11)
RDW: 13.5 % (ref 11.5–15.5)
WBC: 7.9 10*3/uL (ref 4.0–10.5)

## 2018-06-02 LAB — LIPID PANEL
CHOLESTEROL: 176 mg/dL (ref 0–200)
HDL: 63.9 mg/dL (ref >39.00)
LDL CALC: 102 mg/dL — AB (ref 0–99)
NonHDL: 112.53
TRIGLYCERIDES: 51 mg/dL (ref 0.0–149.0)
Total CHOL/HDL Ratio: 3
VLDL: 10.2 mg/dL (ref 0.0–40.0)

## 2018-06-02 LAB — HEPATIC FUNCTION PANEL
ALT: 16 U/L (ref 0–35)
AST: 15 U/L (ref 0–37)
Albumin: 4.4 g/dL (ref 3.5–5.2)
Alkaline Phosphatase: 33 U/L — ABNORMAL LOW (ref 39–117)
BILIRUBIN TOTAL: 0.6 mg/dL (ref 0.2–1.2)
Bilirubin, Direct: 0.1 mg/dL (ref 0.0–0.3)
Total Protein: 7 g/dL (ref 6.0–8.3)

## 2018-06-02 LAB — URINALYSIS, ROUTINE W REFLEX MICROSCOPIC
Bilirubin Urine: NEGATIVE
HGB URINE DIPSTICK: NEGATIVE
Ketones, ur: NEGATIVE
Leukocytes, UA: NEGATIVE
NITRITE: NEGATIVE
RBC / HPF: NONE SEEN (ref 0–?)
SPECIFIC GRAVITY, URINE: 1.015 (ref 1.000–1.030)
Total Protein, Urine: NEGATIVE
URINE GLUCOSE: NEGATIVE
Urobilinogen, UA: 0.2 (ref 0.0–1.0)
pH: 6 (ref 5.0–8.0)

## 2018-06-02 LAB — BASIC METABOLIC PANEL
BUN: 12 mg/dL (ref 6–23)
CO2: 28 mEq/L (ref 19–32)
CREATININE: 0.76 mg/dL (ref 0.40–1.20)
Calcium: 9.8 mg/dL (ref 8.4–10.5)
Chloride: 103 mEq/L (ref 96–112)
GFR: 88.55 mL/min (ref >60.00)
GLUCOSE: 100 mg/dL — AB (ref 70–99)
POTASSIUM: 4.1 meq/L (ref 3.5–5.1)
Sodium: 139 mEq/L (ref 135–145)

## 2018-06-02 LAB — TSH: TSH: 1.83 u[IU]/mL (ref 0.35–4.50)

## 2018-06-02 NOTE — Progress Notes (Signed)
Subjective:    Patient ID: Nancy Becker, female    DOB: 01/11/1976, 42 y.o.   MRN: [id number]  HPI  Patient presents today for complete physical.  Immunizations: tetanus 2014 Diet: healthy Wt Readings from Last 3 Encounters:  06/02/18 169 lb (76.7 kg)  06/06/17 169 lb (76.7 kg)  05/29/17 169 lb (76.7 kg)  Exercise: walks 2 times a day, started back running  Pap Smear: - 2/17-see GYN, has apt in august Mammogram: 06/06/17 Vision: 6/19 Dental: 7/15     Review of Systems  Constitutional: Negative for unexpected weight change.  HENT: Negative for hearing loss and rhinorrhea.   Eyes: Negative for visual disturbance.  Respiratory: Negative for cough and shortness of breath.   Cardiovascular: Negative for chest pain and leg swelling.  Gastrointestinal: Negative for blood in stool, constipation and diarrhea.  Genitourinary: Negative for dysuria, frequency and hematuria.  Musculoskeletal: Negative for arthralgias and myalgias.  Skin: Negative for rash.  Neurological: Negative for headaches.  Hematological: Negative for adenopathy.  Psychiatric/Behavioral:       Denies depression/anxiety   Past Medical History:  Diagnosis Date  . CIN I (cervical intraepithelial neoplasia I)   . HPV (human papilloma virus) infection   . IUD 12/04/2010   Mirena inserted.     Social History   Socioeconomic History  . Marital status: Single    Spouse name: Not on file  . Number of children: 0  . Years of education: Not on file  . Highest education level: Not on file  Occupational History  . Occupation: HerbalistOFTWARE DEVELOPER    Employer: [employer]  Social Needs  . Financial resource strain: Not on file  . Food insecurity:    Worry: Not on file    Inability: Not on file  . Transportation needs:    Medical: Not on file    Non-medical: Not on file  Tobacco Use  . Smoking status: Former Smoker    Packs/day: 0.50    Years: 20.00    Pack years: 10.00    Types: Cigarettes   Last attempt to quit: 09/06/2012    Years since quitting: 5.7  . Smokeless tobacco: Never Used  Substance and Sexual Activity  . Alcohol use: Yes    Alcohol/week: 1.2 oz    Types: 2 Standard drinks or equivalent per week    Comment: occasional  . Drug use: No  . Sexual activity: Yes    Birth control/protection: IUD    Comment: Mirena inserted 12-04-10  Lifestyle  . Physical activity:    Days per week: Not on file    Minutes per session: Not on file  . Stress: Not on file  Relationships  . Social connections:    Talks on phone: Not on file    Gets together: Not on file    Attends religious service: Not on file    Active member of club or organization: Not on file    Attends meetings of clubs or organizations: Not on file    Relationship status: Not on file  . Intimate partner violence:    Fear of current or ex partner: Not on file    Emotionally abused: Not on file    Physically abused: Not on file    Forced sexual activity: Not on file  Other Topics Concern  . Not on file  Social History Narrative   Divorced   Lives with boyfriend   No children   Works as a Systems analystsoftware developer   No  pets   Enjoys running, knitting, sewing, crossfit   Quit smoking 2013    Past Surgical History:  Procedure Laterality Date  . INTRAUTERINE DEVICE INSERTION  01/04/2016  . TYMPANOPLASTY  1987    Family History  Problem Relation Age of Onset  . Hypertension Father   . Heart disease Maternal Grandmother   . Heart disease Maternal Grandfather        heart attack  . Hyperthyroidism Maternal Grandfather   . Other Maternal Grandfather        Vitamin B deficiency  . Hypothyroidism Mother   . Other Mother        vitamin B deficiency  . Hyperthyroidism Maternal Aunt   . Cancer Paternal Grandfather        lung    No Known Allergies  Current Outpatient Medications on File Prior to Visit  Medication Sig Dispense Refill  . ALPRAZolam (XANAX) 0.25 MG tablet Take one tablet by mouth prior  to flight. 4 tablet 0  . levonorgestrel (MIRENA) 20 MCG/24HR IUD 1 each by Intrauterine route once. Inserted 12/04/10.     No current facility-administered medications on file prior to visit.     BP 109/69 (BP Location: Right Arm, Patient Position: Sitting, Cuff Size: Small)   Pulse (!) 54   Temp 98 F (36.7 C) (Oral)   Resp 16   Ht 5\' 6"  (1.676 m)   Wt 169 lb (76.7 kg)   SpO2 100%   BMI 27.28 kg/m       Objective:   Physical Exam  Physical Exam  Constitutional: She is oriented to person, place, and time. She appears well-developed and well-nourished. No distress.  HENT:  Head: Normocephalic and atraumatic.  Right Ear: Tympanic membrane and ear canal normal.  Left Ear: Tympanic membrane and ear canal normal.  Mouth/Throat: Oropharynx is clear and moist.  Eyes: Pupils are equal, round, and reactive to light. No scleral icterus.  Neck: Normal range of motion. No thyromegaly present.  Cardiovascular: Normal rate and regular rhythm.   No murmur heard. Pulmonary/Chest: Effort normal and breath sounds normal. No respiratory distress. He has no wheezes. She has no rales. She exhibits no tenderness.  Abdominal: Soft. Bowel sounds are normal. She exhibits no distension and no mass. There is no tenderness. There is no rebound and no guarding.  Musculoskeletal: She exhibits no edema.  Lymphadenopathy:    She has no cervical adenopathy.  Neurological: She is alert and oriented to person, place, and time. She has normal patellar reflexes. She exhibits normal muscle tone. Coordination normal.  Skin: Skin is warm and dry.  Psychiatric: She has a normal mood and affect. Her behavior is normal. Judgment and thought content normal.  Breasts: Examined lying Right: Without masses, retractions, discharge or axillary adenopathy.  Left: Without masses, retractions, discharge or axillary adenopathy.         Assessment & Plan:   Preventative care- encouraged pt to continue healthy  diet/exercise. Has mammo appointment.  Immunizations reviewed and up to date. She also has GYN appointment scheduled       Assessment & Plan:  EKG tracing is personally reviewed.  EKG notes NSR.  No acute changes.

## 2018-06-02 NOTE — Patient Instructions (Addendum)
Continue healthy diet and regular exercise. Please complete lab work prior to leaving.

## 2018-06-03 ENCOUNTER — Encounter: Payer: Self-pay | Admitting: Family

## 2018-06-09 ENCOUNTER — Encounter (HOSPITAL_BASED_OUTPATIENT_CLINIC_OR_DEPARTMENT_OTHER): Payer: Self-pay

## 2018-06-09 ENCOUNTER — Ambulatory Visit (HOSPITAL_BASED_OUTPATIENT_CLINIC_OR_DEPARTMENT_OTHER)
Admission: RE | Admit: 2018-06-09 | Discharge: 2018-06-09 | Disposition: A | Discharge status: Home / Self Care | Payer: Managed Care, Other (non HMO) | Source: Ambulatory Visit | Attending: Family | Admitting: Family

## 2018-06-09 DIAGNOSIS — Z1231 Encounter for screening mammogram for malignant neoplasm of breast: Secondary | ICD-10-CM | POA: Diagnosis not present

## 2018-07-21 ENCOUNTER — Other Ambulatory Visit: Payer: Self-pay | Admitting: Family

## 2018-07-21 ENCOUNTER — Encounter: Payer: Self-pay | Admitting: Family

## 2018-07-21 MED ORDER — ALPRAZOLAM 0.25 MG PO TABS
ORAL_TABLET | ORAL | 0 refills | Status: DC
Start: 2018-07-21 — End: 2019-04-07

## 2018-07-21 NOTE — Telephone Encounter (Signed)
Last alprazolam RX: 03/14/18, #4 Last OV: 06/02/18 Next OV: 06/05/19 UDS: not on file CSC: not on file CSR: No discrepancies identified

## 2019-01-06 ENCOUNTER — Ambulatory Visit: Payer: Managed Care, Other (non HMO) | Admitting: Podiatry

## 2019-01-06 DIAGNOSIS — B353 Tinea pedis: Secondary | ICD-10-CM | POA: Diagnosis not present

## 2019-01-06 DIAGNOSIS — B351 Tinea unguium: Secondary | ICD-10-CM

## 2019-01-06 MED ORDER — KETOCONAZOLE 2 % EX CREA: 1.0000 "application " | TOPICAL_CREAM | Freq: Every day | CUTANEOUS | 2 refills | Status: DC

## 2019-01-06 NOTE — Patient Instructions (Signed)

## 2019-01-06 NOTE — Progress Notes (Signed)
Subjective:   Patient ID: Nancy Becker, female   DOB: 43 y.o.   MRN: [id number]   HPI 43 year old female presents the office today for concerns of toenail fungus.  She first identified.  She previously been treated for toenail fungus with Sporanox.  She states that it did help quite a bit.  She gets her nail polish over time she is not sure how long this is been going on currently.  She denies any pain in the nails and denies any redness or drainage or any swelling.  She denies any itching to her feet.  She is interested in laser her toenails.   Review of Systems  All other systems reviewed and are negative.  Past Medical History:  Diagnosis Date  . CIN I (cervical intraepithelial neoplasia I)   . HPV (human papilloma virus) infection   . IUD 12/04/2010   Mirena inserted.    Past Surgical History:  Procedure Laterality Date  . INTRAUTERINE DEVICE INSERTION  01/04/2016  . TYMPANOPLASTY  1987     Current Outpatient Medications:  .  ALPRAZolam (XANAX) 0.25 MG tablet, Take one tablet by mouth prior to flight., Disp: 4 tablet, Rfl: 0 .  ketoconazole (NIZORAL) 2 % cream, Apply 1 application topically daily., Disp: 60 g, Rfl: 2 .  levonorgestrel (MIRENA) 20 MCG/24HR IUD, 1 each by Intrauterine route once. Inserted 12/04/10., Disp: , Rfl:   No Known Allergies       Objective:  Physical Exam  General: AAO x3, NAD  Dermatological: Nails are hypertrophic, dystrophic with yellow-brown discoloration.  There is dystrophy to the toenails as well.  There is no pain he also is no significance or tenderness or any signs of infection.  Mild interdigital tinea pedis is present.  No open lesions or any drainage.  Vascular: Dorsalis Pedis artery and Posterior Tibial artery pedal pulses are 2/4 bilateral with immedate capillary fill time. There is no pain with calf compression, swelling, warmth, erythema.   Neruologic: Grossly intact via light touch bilateral.  Protective threshold with  Semmes Wienstein monofilament intact to all pedal sites bilateral.   Musculoskeletal: No gross boney pedal deformities bilateral. No pain, crepitus, or limitation noted with foot and ankle range of motion bilateral. Muscular strength 5/5 in all groups tested bilateral.  Gait: Unassisted, Nonantalgic.       Assessment:   Onychomycosis, tinea pedis    Plan:  -Treatment options discussed including all alternatives, risks, and complications -Etiology of symptoms were discussed -We discussed multiple treatment options for nail fungus.  She does not want to do oral medication.  She is interested in laser we discussed success rates up this.She was started today and just go perform laser to help her toenails up the nails were debrided.  Her husband had a compound cream for the nail fungus in order to save the [employer].  She has tinea pedis as well I ordered ketoconazole to her regular pharmacy.  We discussed external measures to make sure her shoes are stable cleaned and alternating shoes.  RTC 4 weeks for 2nd laser with Shanda Bumps.  Vivi Barrack DPM

## 2019-02-03 ENCOUNTER — Other Ambulatory Visit: Payer: Managed Care, Other (non HMO)

## 2019-02-03 ENCOUNTER — Ambulatory Visit: Payer: Managed Care, Other (non HMO) | Admitting: Podiatry

## 2019-02-24 ENCOUNTER — Telehealth: Payer: Self-pay | Admitting: Podiatry

## 2019-02-24 NOTE — Telephone Encounter (Signed)
Pt called to reschedule her Dr appt as directed and had a question about a Laser treatment that was supposed to be at the same time. Was this to be done during her Dr appt? Please call and inform.

## 2019-03-10 ENCOUNTER — Ambulatory Visit: Payer: Managed Care, Other (non HMO) | Admitting: Podiatry

## 2019-03-12 ENCOUNTER — Ambulatory Visit: Payer: Managed Care, Other (non HMO) | Admitting: Podiatry

## 2019-04-07 ENCOUNTER — Encounter: Payer: Self-pay | Admitting: Podiatry

## 2019-04-07 ENCOUNTER — Ambulatory Visit (INDEPENDENT_AMBULATORY_CARE_PROVIDER_SITE_OTHER): Payer: Managed Care, Other (non HMO) | Admitting: Podiatry

## 2019-04-07 ENCOUNTER — Ambulatory Visit: Payer: Self-pay

## 2019-04-07 ENCOUNTER — Other Ambulatory Visit: Payer: Self-pay

## 2019-04-07 VITALS — Temp 97.7°F

## 2019-04-07 DIAGNOSIS — B351 Tinea unguium: Secondary | ICD-10-CM

## 2019-04-08 NOTE — Progress Notes (Signed)
Subjective: 43 year old female presents the office today for evaluation of nail fungus.  She was using a compound ointment however since the pandemic started she has not been using it as frequently.  She is also not had a laser treatment since I last saw her in March.  Overall she also stopped using the medication because the left big toenail started to come off.  She currently denies any drainage or pus or any redness or swelling. Denies any systemic complaints such as fevers, chills, nausea, vomiting. No acute changes since last appointment, and no other complaints at this time.   Objective: AAO x3, NAD DP/PT pulses palpable bilaterally, CRT less than 3 seconds Overall nails continue be hypertrophic, dystrophic with yellow-brown discoloration.  There is no pain in the nails there is no surrounding redness or drainage or any signs of infection. No open lesions or pre-ulcerative lesions.  No pain with calf compression, swelling, warmth, erythema  Assessment: Onychomycosis  Plan: -All treatment options discussed with the patient including all alternatives, risks, complications.  -The nails were debrided today without any complications. Continue the compound cream for onychomycosis.  Also today she had a second laser treatment.  Appropriate precautions were taken and her nails were all lasered by any complications.  We will follow-up with her in 1 week for another laser treatment. -Patient encouraged to call the office with any questions, concerns, change in symptoms.   Vivi Barrack DPM

## 2019-05-07 ENCOUNTER — Other Ambulatory Visit: Payer: Self-pay

## 2019-05-07 ENCOUNTER — Ambulatory Visit (INDEPENDENT_AMBULATORY_CARE_PROVIDER_SITE_OTHER): Payer: Self-pay | Admitting: Podiatry

## 2019-05-07 DIAGNOSIS — B351 Tinea unguium: Secondary | ICD-10-CM

## 2019-05-12 NOTE — Progress Notes (Signed)
Pt presents with mycotic infection of nails 1 through 5 bilateral.  All other systems are negative  Laser therapy administered to affected nails and tolerated well. All safety precautions were in place.  First treatment.  Follow up in 4 weeks     

## 2019-06-04 ENCOUNTER — Other Ambulatory Visit (HOSPITAL_BASED_OUTPATIENT_CLINIC_OR_DEPARTMENT_OTHER): Payer: Self-pay | Admitting: Family

## 2019-06-04 ENCOUNTER — Other Ambulatory Visit: Payer: Self-pay

## 2019-06-04 DIAGNOSIS — Z1231 Encounter for screening mammogram for malignant neoplasm of breast: Secondary | ICD-10-CM

## 2019-06-05 ENCOUNTER — Ambulatory Visit (INDEPENDENT_AMBULATORY_CARE_PROVIDER_SITE_OTHER): Payer: Managed Care, Other (non HMO) | Admitting: Family

## 2019-06-05 ENCOUNTER — Encounter: Payer: Self-pay | Admitting: Family

## 2019-06-05 VITALS — BP 120/73 | HR 58 | Temp 98.3°F | Resp 16 | Ht 66.0 in | Wt 186.0 lb

## 2019-06-05 DIAGNOSIS — R040 Epistaxis: Secondary | ICD-10-CM | POA: Diagnosis not present

## 2019-06-05 DIAGNOSIS — Z0001 Encounter for general adult medical examination with abnormal findings: Secondary | ICD-10-CM

## 2019-06-05 DIAGNOSIS — E559 Vitamin D deficiency, unspecified: Secondary | ICD-10-CM

## 2019-06-05 DIAGNOSIS — Z0000 Encounter for general adult medical examination without abnormal findings: Secondary | ICD-10-CM

## 2019-06-05 LAB — HEPATIC FUNCTION PANEL
ALT: 12 U/L (ref 0–35)
AST: 15 U/L (ref 0–37)
Albumin: 4.5 g/dL (ref 3.5–5.2)
Alkaline Phosphatase: 41 U/L (ref 39–117)
Bilirubin, Direct: 0.2 mg/dL (ref 0.0–0.3)
Total Bilirubin: 0.9 mg/dL (ref 0.2–1.2)
Total Protein: 7 g/dL (ref 6.0–8.3)

## 2019-06-05 LAB — CBC WITH DIFFERENTIAL/PLATELET
Basophils Absolute: 0 10*3/uL (ref 0.0–0.1)
Basophils Relative: 0.6 % (ref 0.0–3.0)
Eosinophils Absolute: 0.2 10*3/uL (ref 0.0–0.7)
Eosinophils Relative: 2.2 % (ref 0.0–5.0)
HCT: 40.1 % (ref 36.0–46.0)
Hemoglobin: 13.4 g/dL (ref 12.0–15.0)
Lymphocytes Relative: 29.6 % (ref 12.0–46.0)
Lymphs Abs: 2.4 10*3/uL (ref 0.7–4.0)
MCHC: 33.4 g/dL (ref 30.0–36.0)
MCV: 96.1 fl (ref 78.0–100.0)
Monocytes Absolute: 0.6 10*3/uL (ref 0.1–1.0)
Monocytes Relative: 7.7 % (ref 3.0–12.0)
Neutro Abs: 4.8 10*3/uL (ref 1.4–7.7)
Neutrophils Relative %: 59.9 % (ref 43.0–77.0)
Platelets: 281 10*3/uL (ref 150.0–400.0)
RBC: 4.17 Mil/uL (ref 3.87–5.11)
RDW: 14.1 % (ref 11.5–15.5)
WBC: 8 10*3/uL (ref 4.0–10.5)

## 2019-06-05 LAB — BASIC METABOLIC PANEL
BUN: 12 mg/dL (ref 6–23)
CO2: 28 mEq/L (ref 19–32)
Calcium: 9.7 mg/dL (ref 8.4–10.5)
Chloride: 105 mEq/L (ref 96–112)
Creatinine, Ser: 0.72 mg/dL (ref 0.40–1.20)
GFR: 88.25 mL/min (ref >60.00)
Glucose, Bld: 103 mg/dL — ABNORMAL HIGH (ref 70–99)
Potassium: 4.3 mEq/L (ref 3.5–5.1)
Sodium: 139 mEq/L (ref 135–145)

## 2019-06-05 LAB — TSH: TSH: 1.83 u[IU]/mL (ref 0.35–4.50)

## 2019-06-05 LAB — LIPID PANEL
Cholesterol: 194 mg/dL (ref 0–200)
HDL: 75 mg/dL (ref >39.00)
LDL Cholesterol: 106 mg/dL — ABNORMAL HIGH (ref 0–99)
NonHDL: 119.09
Total CHOL/HDL Ratio: 3
Triglycerides: 63 mg/dL (ref 0.0–149.0)
VLDL: 12.6 mg/dL (ref 0.0–40.0)

## 2019-06-05 NOTE — Progress Notes (Signed)
Subjective:    Patient ID: Nancy Becker, female    DOB: 01-01-1976, 43 y.o.   MRN: [id number]  HPI  Patient is a 43 yr old female who presents today for cpx.  Immunizations: Tetanus in 2014 Diet: reports that she is doing "ok" with diet.  Reports that [phone] and her brother's death have hurt her diet. Exercise: she started back running 3x a week Wt Readings from Last 3 Encounters:  06/05/19 186 lb (84.4 kg)  06/02/18 169 lb (76.7 kg)  06/06/17 169 lb (76.7 kg)  Pap Smear: 12/07/15- scheduled Mammogram: 06/09/18, scheduled Dental: up to date Vision:  6/19   Epistaxis- reports 3-4 nose bleeds in the last month. Wakes her up in the middle of the night.    Review of Systems  Constitutional: Positive for unexpected weight change.  HENT: Negative for hearing loss and rhinorrhea.   Eyes: Negative for visual disturbance.  Respiratory: Negative for cough and shortness of breath.   Cardiovascular: Negative for chest pain.  Gastrointestinal: Negative for blood in stool, constipation and diarrhea.  Genitourinary: Negative for dysuria, frequency and hematuria.  Musculoskeletal: Negative for arthralgias and myalgias.  Skin: Negative for rash.  Neurological: Negative for headaches.  Hematological: Negative for adenopathy.  Psychiatric/Behavioral:       Denies depression/anxiety   Past Medical History:  Diagnosis Date  . CIN I (cervical intraepithelial neoplasia I)   . HPV (human papilloma virus) infection   . IUD 12/04/2010   Mirena inserted.     Social History   Socioeconomic History  . Marital status: Single    Spouse name: Not on file  . Number of children: 0  . Years of education: Not on file  . Highest education level: Not on file  Occupational History  . Occupation: HerbalistOFTWARE DEVELOPER    Employer: [employer]  Social Needs  . Financial resource strain: Not on file  . Food insecurity    Worry: Not on file    Inability: Not on file  . Transportation needs   Medical: Not on file    Non-medical: Not on file  Tobacco Use  . Smoking status: Former Smoker    Packs/day: 0.50    Years: 20.00    Pack years: 10.00    Types: Cigarettes    Quit date: 09/06/2012    Years since quitting: 6.7  . Smokeless tobacco: Never Used  Substance and Sexual Activity  . Alcohol use: Yes    Alcohol/week: 2.0 standard drinks    Types: 2 Standard drinks or equivalent per week    Comment: occasional  . Drug use: No  . Sexual activity: Yes    Birth control/protection: I.U.D.    Comment: Mirena inserted 12-04-10  Lifestyle  . Physical activity    Days per week: Not on file    Minutes per session: Not on file  . Stress: Not on file  Relationships  . Social Musicianconnections    Talks on phone: Not on file    Gets together: Not on file    Attends religious service: Not on file    Active member of club or organization: Not on file    Attends meetings of clubs or organizations: Not on file    Relationship status: Not on file  . Intimate partner violence    Fear of current or ex partner: Not on file    Emotionally abused: Not on file    Physically abused: Not on file    Forced sexual activity:  Not on file  Other Topics Concern  . Not on file  Social History Narrative   Divorced   Lives with boyfriend   No children   Works as a Gaffer   No pets   Enjoys running, knitting, sewing, crossfit   Quit smoking 2013    Past Surgical History:  Procedure Laterality Date  . INTRAUTERINE DEVICE INSERTION  01/04/2016  . TYMPANOPLASTY  1987    Family History  Problem Relation Age of Onset  . Hypertension Father   . Heart disease Maternal Grandmother   . Heart disease Maternal Grandfather        heart attack  . Hyperthyroidism Maternal Grandfather   . Other Maternal Grandfather        Vitamin B deficiency  . Hypothyroidism Mother   . Other Mother        b12 deficiency  . Hyperthyroidism Maternal Aunt   . Cancer Paternal Grandfather        lung  .  Cardiomyopathy Brother        myocarditis, died 59    No Known Allergies  Current Outpatient Medications on File Prior to Visit  Medication Sig Dispense Refill  . levonorgestrel (MIRENA) 20 MCG/24HR IUD 1 each by Intrauterine route once. Inserted 12/04/10.     No current facility-administered medications on file prior to visit.     BP 120/73 (BP Location: Right Arm, Patient Position: Sitting, Cuff Size: Small)   Pulse (!) 58   Temp 98.3 F (36.8 C) (Oral)   Resp 16   Ht 5\' 6"  (1.676 m)   Wt 186 lb (84.4 kg)   SpO2 100%   BMI 30.02 kg/m       Objective:   Physical Exam  Physical Exam  Constitutional: She is oriented to person, place, and time. She appears well-developed and well-nourished. No distress.  HENT:  Head: Normocephalic and atraumatic.  Right Ear: Tympanic membrane and ear canal normal.  Left Ear: Tympanic membrane and ear canal normal.  Mouth/Throat: not examined- pt wearing mask Eyes: Pupils are equal, round, and reactive to light. No scleral icterus.  Neck: Normal range of motion. No thyromegaly present.  Cardiovascular: Normal rate and regular rhythm.   No murmur heard. Pulmonary/Chest: Effort normal and breath sounds normal. No respiratory distress. He has no wheezes. She has no rales. She exhibits no tenderness.  Abdominal: Soft. Bowel sounds are normal. She exhibits no distension and no mass. There is no tenderness. There is no rebound and no guarding.  Musculoskeletal: She exhibits no edema.  Lymphadenopathy:    She has no cervical adenopathy.  Neurological: She is alert and oriented to person, place, and time. She has normal patellar reflexes. She exhibits normal muscle tone. Coordination normal.  Skin: Skin is warm and dry.  Psychiatric: She has a normal mood and affect. Her behavior is normal. Judgment and thought content normal.  Breast/pelvic: deferred           Assessment & Plan:   Preventative- discussed healthy diet, exercise and  weight loss.  Obtain routine lab work. Pap scheduled.  Tetanus up to date.   Epistaxis- will refer to ENT for further evaluation.       Assessment & Plan:

## 2019-06-05 NOTE — Patient Instructions (Signed)
Please complete lab work prior to leaving. Continue to work on [employer], exercise, and weight loss.

## 2019-06-09 LAB — VITAMIN D 1,25 DIHYDROXY
Vitamin D 1, 25 (OH)2 Total: 55 pg/mL (ref 18–72)
Vitamin D2 1, 25 (OH)2: <8 pg/mL
Vitamin D3 1, 25 (OH)2: 55 pg/mL

## 2019-06-11 ENCOUNTER — Ambulatory Visit: Payer: Self-pay

## 2019-06-11 ENCOUNTER — Other Ambulatory Visit: Payer: Self-pay

## 2019-06-11 ENCOUNTER — Ambulatory Visit (HOSPITAL_BASED_OUTPATIENT_CLINIC_OR_DEPARTMENT_OTHER)
Admission: RE | Admit: 2019-06-11 | Discharge: 2019-06-11 | Disposition: A | Discharge status: Home / Self Care | Payer: Managed Care, Other (non HMO) | Source: Ambulatory Visit | Attending: Family | Admitting: Family

## 2019-06-11 DIAGNOSIS — Z1231 Encounter for screening mammogram for malignant neoplasm of breast: Secondary | ICD-10-CM | POA: Diagnosis present

## 2019-06-11 DIAGNOSIS — B351 Tinea unguium: Secondary | ICD-10-CM

## 2019-06-30 ENCOUNTER — Encounter (INDEPENDENT_AMBULATORY_CARE_PROVIDER_SITE_OTHER): Payer: Self-pay

## 2019-06-30 ENCOUNTER — Telehealth: Payer: Managed Care, Other (non HMO) | Admitting: Nurse Practitioner

## 2019-06-30 DIAGNOSIS — Z20822 Contact with and (suspected) exposure to covid-19: Secondary | ICD-10-CM

## 2019-06-30 MED ORDER — BENZONATATE 100 MG PO CAPS: 100.0000 mg | ORAL_CAPSULE | Freq: Three times a day (TID) | ORAL | 0 refills | Status: DC | PRN

## 2019-06-30 NOTE — Progress Notes (Signed)
E-Visit for Corona Virus Screening   Your current symptoms could be consistent with the coronavirus.  Many health care providers can now test patients at their office but not all are.  [facility] has multiple testing sites. For information on our COVID testing locations and hours go to [email or web address]  Please quarantine yourself while awaiting your test results.  We are enrolling you in our MyChart Home Montioring for COVID19 . Daily you will receive a questionnaire within the MyChart website. Our COVID 19 response team willl be monitoriing your responses daily.  You can go to one of the  testing sites listed below, while they are opened (see hours). You do not need a doctors order to be tested for covid.You do need to self-isolate until your results return and if positive 14 days from when your symptoms started and until you are 3 days symptom free.   Testing Locations (Monday - Friday, 8 a.m. - 3:30 p.m.) . [place]: [facility], [address], [place], [place]  . [place]: [facility], [address], [place], [place] (entrance off [address])  . [place]: [address], [place], [place] (across from [facility])    COVID-19 is a respiratory illness with symptoms that are similar to the flu. Symptoms are typically mild to moderate, but there have been cases of severe illness and death due to the virus. The following symptoms may appear 2-14 days after exposure: . Fever . Cough . Shortness of breath or difficulty breathing . Chills . Repeated shaking with chills . Muscle pain . Headache . Sore throat . New loss of taste or smell . Fatigue . Congestion or runny nose . Nausea or vomiting . Diarrhea  It is vitally important that if you feel that you have an infection such as this virus or any other virus that you stay home and away from places where you may  spread it to others.  You should self-quarantine for 14 days if you have symptoms that could potentially be coronavirus or have been in close contact a with a person diagnosed with COVID-19 within the last 2 weeks. You should avoid contact with people age 65 and older.   You should wear a mask or cloth face covering over your nose and mouth if you must be around other people or animals, including pets (even at home). Try to stay at least 6 feet away from other people. This will protect the people around you.  You can use medication such as A prescription cough medication called Tessalon Perles 100 mg. You may take 1-2 capsules every 8 hours as needed for cough  You may also take acetaminophen (Tylenol) as needed for fever.   Reduce your risk of any infection by using the same precautions used for avoiding the common cold or flu:  . Wash your hands often with soap and warm water for at least 20 seconds.  If soap and water are not readily available, use an alcohol-based hand sanitizer with at least 60% alcohol.  . If coughing or sneezing, cover your mouth and nose by coughing or sneezing into the elbow areas of your shirt or coat, into a tissue or into your sleeve (not your hands). . Avoid shaking hands with others and consider head nods or verbal greetings only. . Avoid touching your eyes, nose, or mouth with unwashed hands.  . Avoid close contact with people who are sick. . Avoid places or   events with large numbers of people in one location, like concerts or sporting events. . Carefully consider travel plans you have or are making. . If you are planning any travel outside or inside the [place], visit the CDC's Travelers' Health webpage for the latest health notices. . If you have some symptoms but not all symptoms, continue to monitor at home and seek medical attention if your symptoms worsen. . If you are having a medical emergency, call 911.  HOME CARE . Only take medications as instructed by your  medical team. . Drink plenty of fluids and get plenty of rest. . A steam or ultrasonic humidifier can help if you have congestion.   GET HELP RIGHT AWAY IF YOU HAVE EMERGENCY WARNING SIGNS** FOR COVID-19. If you or someone is showing any of these signs seek emergency medical care immediately. Call 911 or proceed to your closest emergency facility if: . You develop worsening high fever. . Trouble breathing . Bluish lips or face . Persistent pain or pressure in the chest . New confusion . Inability to wake or stay awake . You cough up blood. . Your symptoms become more severe  **This list is not all possible symptoms. Contact your medical provider for any symptoms that are sever or concerning to you.   MAKE SURE YOU   Understand these instructions.  Will watch your condition.  Will get help right away if you are not doing well or get worse.  Your e-visit answers were reviewed by a board certified advanced clinical practitioner to complete your personal care plan.  Depending on the condition, your plan could have included both over the counter or prescription medications.  If there is a problem please reply once you have received a response from your provider.  Your safety is important to us.  If you have drug allergies check your prescription carefully.    You can use MyChart to ask questions about today's visit, request a non-urgent call back, or ask for a work or school excuse for 24 hours related to this e-Visit. If it has been greater than 24 hours you will need to follow up with your provider, or enter a new e-Visit to address those concerns. You will get an e-mail in the next two days asking about your experience.  I hope that your e-visit has been valuable and will speed your recovery. Thank you for using e-visits.   5-10 minutes spent reviewing and documenting in chart.  

## 2019-07-01 ENCOUNTER — Other Ambulatory Visit: Payer: Self-pay

## 2019-07-01 ENCOUNTER — Encounter (INDEPENDENT_AMBULATORY_CARE_PROVIDER_SITE_OTHER): Payer: Self-pay

## 2019-07-01 DIAGNOSIS — Z20822 Contact with and (suspected) exposure to covid-19: Secondary | ICD-10-CM

## 2019-07-02 ENCOUNTER — Encounter (INDEPENDENT_AMBULATORY_CARE_PROVIDER_SITE_OTHER): Payer: Self-pay

## 2019-07-02 LAB — NOVEL CORONAVIRUS, NAA: SARS-CoV-2, NAA: DETECTED — AB

## 2019-07-03 ENCOUNTER — Encounter (INDEPENDENT_AMBULATORY_CARE_PROVIDER_SITE_OTHER): Payer: Self-pay

## 2019-07-05 ENCOUNTER — Encounter (INDEPENDENT_AMBULATORY_CARE_PROVIDER_SITE_OTHER): Payer: Self-pay

## 2019-07-06 ENCOUNTER — Encounter (INDEPENDENT_AMBULATORY_CARE_PROVIDER_SITE_OTHER): Payer: Self-pay

## 2019-07-07 ENCOUNTER — Encounter (INDEPENDENT_AMBULATORY_CARE_PROVIDER_SITE_OTHER): Payer: Self-pay

## 2019-07-08 ENCOUNTER — Encounter (INDEPENDENT_AMBULATORY_CARE_PROVIDER_SITE_OTHER): Payer: Self-pay

## 2019-07-10 NOTE — Progress Notes (Signed)
Pt presents with mycotic infection of nails 1 through 5 bilateral.  All other systems are negative  Laser therapy administered to affected nails and tolerated well. All safety precautions were in place. 2nd treatment.  Follow up in 4 weeks

## 2019-07-12 ENCOUNTER — Encounter (INDEPENDENT_AMBULATORY_CARE_PROVIDER_SITE_OTHER): Payer: Self-pay

## 2019-07-13 ENCOUNTER — Encounter (INDEPENDENT_AMBULATORY_CARE_PROVIDER_SITE_OTHER): Payer: Self-pay

## 2020-04-15 LAB — HM PAP SMEAR: HM Pap smear: NEGATIVE

## 2020-06-14 ENCOUNTER — Encounter: Payer: Managed Care, Other (non HMO) | Admitting: Family

## 2020-06-24 ENCOUNTER — Encounter: Payer: Managed Care, Other (non HMO) | Admitting: Family

## 2020-06-28 ENCOUNTER — Other Ambulatory Visit (HOSPITAL_BASED_OUTPATIENT_CLINIC_OR_DEPARTMENT_OTHER): Payer: Self-pay | Admitting: Family

## 2020-06-28 DIAGNOSIS — Z1231 Encounter for screening mammogram for malignant neoplasm of breast: Secondary | ICD-10-CM

## 2020-07-04 ENCOUNTER — Ambulatory Visit (HOSPITAL_BASED_OUTPATIENT_CLINIC_OR_DEPARTMENT_OTHER): Payer: Managed Care, Other (non HMO)

## 2020-07-04 ENCOUNTER — Encounter: Payer: Managed Care, Other (non HMO) | Admitting: Family

## 2020-08-02 ENCOUNTER — Encounter: Payer: Managed Care, Other (non HMO) | Admitting: Family

## 2020-08-03 ENCOUNTER — Encounter: Payer: Self-pay | Admitting: Family

## 2020-08-03 ENCOUNTER — Other Ambulatory Visit: Payer: Self-pay

## 2020-08-03 ENCOUNTER — Ambulatory Visit (INDEPENDENT_AMBULATORY_CARE_PROVIDER_SITE_OTHER): Payer: Managed Care, Other (non HMO) | Admitting: Family

## 2020-08-03 ENCOUNTER — Ambulatory Visit (HOSPITAL_BASED_OUTPATIENT_CLINIC_OR_DEPARTMENT_OTHER)
Admission: RE | Admit: 2020-08-03 | Discharge: 2020-08-03 | Disposition: A | Discharge status: Home / Self Care | Payer: Managed Care, Other (non HMO) | Source: Ambulatory Visit | Attending: Family | Admitting: Family

## 2020-08-03 VITALS — BP 126/86 | HR 56 | Temp 98.4°F | Resp 16 | Ht 66.0 in | Wt 186.0 lb

## 2020-08-03 DIAGNOSIS — R739 Hyperglycemia, unspecified: Secondary | ICD-10-CM

## 2020-08-03 DIAGNOSIS — Z1231 Encounter for screening mammogram for malignant neoplasm of breast: Secondary | ICD-10-CM | POA: Diagnosis present

## 2020-08-03 DIAGNOSIS — E559 Vitamin D deficiency, unspecified: Secondary | ICD-10-CM

## 2020-08-03 DIAGNOSIS — Z0000 Encounter for general adult medical examination without abnormal findings: Secondary | ICD-10-CM

## 2020-08-03 DIAGNOSIS — Z8349 Family history of other endocrine, nutritional and metabolic diseases: Secondary | ICD-10-CM

## 2020-08-03 NOTE — Progress Notes (Signed)
Subjective:    Patient ID: Nancy Becker, female    DOB: [birth year], 44 y.o.   MRN: [id number]  HPI  Patient presents today for complete physical.  Immunizations: declines flu shot, declines covid vaccine. States she has had covid-19 twice Diet: just started back with her nutritionist Wt Readings from Last 3 Encounters:  08/03/20 186 lb (84.4 kg)  06/05/19 186 lb (84.4 kg)  06/02/18 169 lb (76.7 kg)  Exercise: walking (3 miles a day) was running  Pap Smear: up to date Mammogram: today Vision: due Dental: up to date    Review of Systems  Constitutional: Negative for unexpected weight change.  HENT: Negative for hearing loss and rhinorrhea.   Eyes: Negative for visual disturbance.  Respiratory: Negative for cough and shortness of breath.   Cardiovascular: Negative for chest pain.  Gastrointestinal: Negative for constipation and diarrhea.  Genitourinary: Negative for dysuria, frequency and menstrual problem.  Musculoskeletal: Negative for arthralgias and myalgias.  Skin: Negative for rash.  Neurological: Negative for headaches.  Hematological: Negative for adenopathy.  Psychiatric/Behavioral:       Denies depression/anxiety   Past Medical History:  Diagnosis Date   CIN I (cervical intraepithelial neoplasia I)    HPV (human papilloma virus) infection    IUD 12/04/2010   Mirena inserted.     Social History   Socioeconomic History   Marital status: Single    Spouse name: Not on file   Number of children: 0   Years of education: Not on file   Highest education level: Not on file  Occupational History   Occupation: SOFTWARE Copywriter, advertising    Employer: [employer]  Tobacco Use   Smoking status: Former Smoker    Packs/day: 0.50    Years: 20.00    Pack years: 10.00    Types: Cigarettes    Quit date: 09/06/2012    Years since quitting: 7.9   Smokeless tobacco: Never Used  Substance and Sexual Activity   Alcohol use: Yes    Alcohol/week: 2.0 standard  drinks    Types: 2 Standard drinks or equivalent per week    Comment: occasional   Drug use: No   Sexual activity: Yes    Birth control/protection: I.U.D.    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)   No pets   Enjoys running, Archivist, sewing, crossfit   Quit smoking 2013   Social Determinants of Corporate investment banker Strain:    Difficulty of Paying Living Expenses: Not on [employer] Insecurity:    Worried About Programme researcher, broadcasting/film/video in the Last Year: Not on file   [employer] of Food in the Last Year: Not on file  Transportation Needs:    Freight forwarder (Medical): Not on file   Lack of Transportation (Non-Medical): Not on file  Physical Activity:    Days of Exercise per Week: Not on file   Minutes of Exercise per Session: Not on file  Stress:    Feeling of Stress : Not on file  Social Connections:    Frequency of Communication with Friends and Family: Not on file   Frequency of Social Gatherings with Friends and Family: Not on file   Attends Religious Services: Not on file   Active Member of Clubs or Organizations: Not on  file   Attends Banker Meetings: Not on file   Marital Status: Not on file  Intimate Partner Violence:    Fear of Current or Ex-Partner: Not on file   Emotionally Abused: Not on file   Physically Abused: Not on file   Sexually Abused: Not on file    Past Surgical History:  Procedure Laterality Date   INTRAUTERINE DEVICE INSERTION  01/04/2016   TYMPANOPLASTY  1987    Family History  Problem Relation Age of Onset   Hypertension Father    Heart disease Maternal Grandmother    Heart disease Maternal Grandfather        heart attack   Hyperthyroidism Maternal Grandfather    Other Maternal Grandfather        Vitamin B deficiency    Hypothyroidism Mother    Other Mother        b12 deficiency   Hyperthyroidism Maternal Aunt    Cancer Paternal Grandfather        lung   Cardiomyopathy Brother        myocarditis, died 64    No Known Allergies  Current Outpatient Medications on File Prior to Visit  Medication Sig Dispense Refill   levonorgestrel (MIRENA) 20 MCG/24HR IUD 1 each by Intrauterine route once. Inserted 12/04/10.     No current facility-administered medications on file prior to visit.    BP 126/86 (BP Location: Right Arm, Patient Position: Sitting, Cuff Size: Small)    Pulse (!) 56    Temp 98.4 F (36.9 C) (Oral)    Resp 16    Ht 5\' 6"  (1.676 m)    Wt 186 lb (84.4 kg)    SpO2 100%    BMI 30.02 kg/m       Objective:   Physical Exam  Physical Exam  Constitutional: She is oriented to person, place, and time. She appears well-developed and well-nourished. No distress.  HENT:  Head: Normocephalic and atraumatic.  Right Ear: Tympanic membrane and ear canal normal.  Left Ear: Tympanic membrane and ear canal normal.  Mouth/Throat: Not examined pt wearing mask Eyes: Pupils are equal, round, and reactive to light. No scleral icterus.  Neck: Normal range of motion. No thyromegaly present.  Cardiovascular: Normal rate and regular rhythm.   No murmur heard. Pulmonary/Chest: Effort normal and breath sounds normal. No respiratory distress. He has no wheezes. She has no rales. She exhibits no tenderness.  Abdominal: Soft. Bowel sounds are normal. She exhibits no distension and no mass. There is no tenderness. There is no rebound and no guarding.  Musculoskeletal: She exhibits no edema.  Lymphadenopathy:    She has no cervical adenopathy.  Neurological: She is alert and oriented to person, place, and time. She has normal patellar reflexes. She exhibits normal muscle tone. Coordination normal.  Skin: Skin is warm and dry.  Psychiatric: She has a normal mood and affect. Her behavior is normal. Judgment and  thought content normal.            Assessment & Plan:    Preventative care- discussed healthy diet, exercise and weight loss. Obtain routine lab work.  I did repeat her bp and follow up bp was 125/75 which is better. Vaccine counseling provided.  Declines flu shot and covid vaccine.  Pap up to date. Will complete mammo today.  Labs as ordered.     This visit occurred during the SARS-CoV-2 public health emergency.  Safety protocols were in place, including screening questions prior to the visit,  additional usage of staff PPE, and extensive cleaning of exam room while observing appropriate contact time as indicated for disinfecting solutions.    Assessment & Plan:

## 2020-08-03 NOTE — Patient Instructions (Signed)
Please complete lab work prior to leaving.  Continue your work on healthy diet, exercise and weight loss.  

## 2020-08-04 ENCOUNTER — Telehealth: Payer: Self-pay | Admitting: Family

## 2020-08-04 ENCOUNTER — Other Ambulatory Visit: Payer: Self-pay

## 2020-08-04 DIAGNOSIS — E559 Vitamin D deficiency, unspecified: Secondary | ICD-10-CM

## 2020-08-04 LAB — VITAMIN D 25 HYDROXY (VIT D DEFICIENCY, FRACTURES): Vit D, 25-Hydroxy: 22 ng/mL — ABNORMAL LOW (ref 30–100)

## 2020-08-04 LAB — COMPREHENSIVE METABOLIC PANEL
AG Ratio: 1.7 (calc) (ref 1.0–2.5)
ALT: 18 U/L (ref 6–29)
AST: 19 U/L (ref 10–30)
Albumin: 4.5 g/dL (ref 3.6–5.1)
Alkaline phosphatase (APISO): 46 U/L (ref 31–125)
BUN: 10 mg/dL (ref 7–25)
CO2: 28 mmol/L (ref 20–32)
Calcium: 9.9 mg/dL (ref 8.6–10.2)
Chloride: 102 mmol/L (ref 98–110)
Creat: 0.76 mg/dL (ref 0.50–1.10)
Globulin: 2.6 g/dL (calc) (ref 1.9–3.7)
Glucose, Bld: 101 mg/dL — ABNORMAL HIGH (ref 65–99)
Potassium: 4.2 mmol/L (ref 3.5–5.3)
Sodium: 138 mmol/L (ref 135–146)
Total Bilirubin: 0.8 mg/dL (ref 0.2–1.2)
Total Protein: 7.1 g/dL (ref 6.1–8.1)

## 2020-08-04 LAB — TSH: TSH: 1.72 mIU/L

## 2020-08-04 MED ORDER — VITAMIN D (ERGOCALCIFEROL) 1.25 MG (50000 UNIT) PO CAPS: 50000.0000 [IU] | ORAL_CAPSULE | ORAL | 0 refills | Status: AC

## 2020-08-04 NOTE — Telephone Encounter (Signed)
Patient advised of results and new prescription. She will pick up and she was scheduled to repeat vit d in 3 months.

## 2020-08-04 NOTE — Telephone Encounter (Signed)
Vitamin D level is low.  Advise patient to begin vit D 50000 units once weekly for 12 weeks, then repeat vit D level (dx Vit D deficiency).    Sugar was very mildly elevated. Kidney function, liver function are normal.

## 2020-08-25 ENCOUNTER — Encounter: Payer: Self-pay | Admitting: Family

## 2020-08-26 MED ORDER — ALPRAZOLAM 0.5 MG PO TABS: ORAL_TABLET | ORAL | 0 refills | Status: AC

## 2020-11-02 ENCOUNTER — Other Ambulatory Visit (INDEPENDENT_AMBULATORY_CARE_PROVIDER_SITE_OTHER): Payer: Managed Care, Other (non HMO)

## 2020-11-02 ENCOUNTER — Other Ambulatory Visit: Payer: Self-pay

## 2020-11-02 DIAGNOSIS — E559 Vitamin D deficiency, unspecified: Secondary | ICD-10-CM | POA: Diagnosis not present

## 2020-11-08 LAB — VITAMIN D 1,25 DIHYDROXY
Vitamin D 1, 25 (OH)2 Total: 42 pg/mL (ref 18–72)
Vitamin D2 1, 25 (OH)2: 20 pg/mL
Vitamin D3 1, 25 (OH)2: 22 pg/mL

## 2021-07-16 ENCOUNTER — Encounter: Payer: Self-pay | Admitting: Family

## 2021-07-18 ENCOUNTER — Ambulatory Visit (INDEPENDENT_AMBULATORY_CARE_PROVIDER_SITE_OTHER): Payer: Managed Care, Other (non HMO) | Admitting: Family

## 2021-07-18 ENCOUNTER — Encounter: Payer: Self-pay | Admitting: Family

## 2021-07-18 ENCOUNTER — Other Ambulatory Visit: Payer: Self-pay

## 2021-07-18 VITALS — BP 122/70 | HR 63 | Temp 98.6°F | Ht 66.0 in | Wt 166.8 lb

## 2021-07-18 DIAGNOSIS — R1310 Dysphagia, unspecified: Secondary | ICD-10-CM | POA: Diagnosis not present

## 2021-07-18 MED ORDER — OMEPRAZOLE 40 MG PO CPDR: 40.0000 mg | DELAYED_RELEASE_CAPSULE | Freq: Every day | ORAL | 3 refills | Status: DC

## 2021-07-18 NOTE — Progress Notes (Signed)
Subjective:   By signing my name below, I, Lyric Barr-McArthur, attest that this documentation has been prepared under the direction and in the presence of Sandford Craze, NP, 07/18/2021   Patient ID: Nancy Becker, female    DOB: 05-Mar-1976, 45 y.o.   MRN: [id number]  Chief Complaint  Patient presents with   trouble swallowing     Going on a month, pt started feeling this way only in public but now at home she is feeling the same way. With both liquids and solid foods.     HPI Patient is in today for an office visit.   Swallowing: She complains of issues swallowing. She explains that it feels like her throat is closing up specifically on one side over the other. She had eaten breakfast yesterday and by lunch was unable to eat due to difficulty swallowing. She also notes that she runs and walks daily and at the end of her walk yesterday she felt the sensation of her throat closing again.  Anxiety: She denies any regular anxiety and only experiences anxiety when eating out in public.  As a result she has stopped eating out completely. She now feels like the swallowing issues that used to happen only in public are now happening at home and it makes her very nervous.  She is afraid that she won't be able to breath.  Reflux: She denies having any episodes of acid reflux.    Health Maintenance Due  Topic Date Due   COVID-19 Vaccine (1) Never done   HIV Screening  Never done   Hepatitis C Screening  Never done   COLONOSCOPY (Pts 45-69yrs Insurance coverage will need to be confirmed)  Never done   PAP SMEAR-Modifier  04/15/2021   INFLUENZA VACCINE  Never done    Past Medical History:  Diagnosis Date   CIN I (cervical intraepithelial neoplasia I)    HPV (human papilloma virus) infection    IUD 12/04/2010   Mirena inserted.    Past Surgical History:  Procedure Laterality Date   INTRAUTERINE DEVICE INSERTION  01/04/2016   TYMPANOPLASTY  1987    Family History  Problem  Relation Age of Onset   Hypertension Father    Heart disease Maternal Grandmother    Heart disease Maternal Grandfather        heart attack   Hyperthyroidism Maternal Grandfather    Other Maternal Grandfather        Vitamin B deficiency   Hypothyroidism Mother    Other Mother        b12 deficiency   Hyperthyroidism Maternal Aunt    Cancer Paternal Grandfather        lung   Cardiomyopathy Brother        myocarditis, died 14    Social History   Socioeconomic History   Marital status: Single    Spouse name: Not on file   Number of children: 0   Years of education: Not on file   Highest education level: Not on file  Occupational History   Occupation: SOFTWARE Copywriter, advertising    Employer: [employer]  Tobacco Use   Smoking status: Former    Packs/day: 0.50    Years: 20.00    Pack years: 10.00    Types: Cigarettes    Quit date: 09/06/2012    Years since quitting: 8.8   Smokeless tobacco: Never  Substance and Sexual Activity   Alcohol use: Yes    Alcohol/week: 2.0 standard drinks    Types: 2  Standard drinks or equivalent per week    Comment: occasional   Drug use: No   Sexual activity: Yes    Birth control/protection: I.U.D.    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 4 tablet 0   levonorgestrel (MIRENA) 20 MCG/24HR  IUD 1 each by Intrauterine route once. Inserted 12/04/10.     No facility-administered medications prior to visit.    Allergies  Allergen Reactions   Bee Venom Swelling    ROS  See HPI     Objective:    Physical Exam Constitutional:      General: She is not in acute distress.    Appearance: Normal appearance. She is not ill-appearing.  HENT:     Head: Normocephalic and atraumatic.     Right Ear: Ear canal and external ear normal.     Left Ear: Tympanic membrane, ear canal and external ear normal.     Ears:     Comments: Surgical changes to TM in right ear    Mouth/Throat:     Mouth: Mucous membranes are dry.     Pharynx: No oropharyngeal exudate or posterior oropharyngeal erythema.  Eyes:     Extraocular Movements: Extraocular movements intact.     Pupils: Pupils are equal, round, and reactive to light.     Comments: (-) nystagmus  Cardiovascular:     Rate and Rhythm: Normal rate and regular rhythm.     Heart sounds: Normal heart sounds. No murmur heard.   No gallop.  Pulmonary:     Effort: Pulmonary effort is normal. No respiratory distress.     Breath sounds: Normal breath sounds. No wheezing or rales.  Skin:    General: Skin is warm and dry.  Neurological:     Mental Status: She is alert and oriented to person, place, and time.  Psychiatric:        Attention and Perception: Attention normal.        Mood and Affect: Affect is tearful.        Behavior: Behavior is hyperactive (restless, trouble sitting still).        Thought Content: Thought content normal.        Judgment: Judgment normal.    BP 122/70   Pulse 63   Temp 98.6 F (37 C) (Oral)   Ht 5\' 6"  (1.676 m)   Wt 166 lb 12.8 oz (75.7 kg)   SpO2 98%   BMI 26.92 kg/m  Wt Readings from Last 3 Encounters:  07/18/21 166 lb 12.8 oz (75.7 kg)  08/03/20 186 lb (84.4 kg)  06/05/19 186 lb (84.4 kg)       Assessment & Plan:   Problem List Items Addressed This Visit       Unprioritized  Dysphagia -  Primary    New.  Recommended dysphagia diet, trial of omeprazole and referral to GI. She is having a significant amount of anxiety surrounding her dysphagia.  If GI work up unrevealing, may consider further evaluation/treatment of anxiety.        Relevant Medications   omeprazole (PRILOSEC) 40 MG capsule   Other Relevant Orders   Ambulatory referral to Gastroenterology    Meds ordered this encounter  Medications   omeprazole (PRILOSEC) 40 MG capsule    Sig: Take 1 capsule (40 mg total) by mouth daily.    Dispense:  30 capsule    Refill:  3    Order Specific Question:   Supervising Provider    Answer:   Danise Edge A [[id number]]    I, Sandford Craze, NP, personally preformed the services described in this documentation.  All medical record entries made by the scribe were at my direction and in my presence.  I have reviewed the chart and discharge instructions (if applicable) and agree that the record reflects my personal performance and is accurate and complete. 07/18/2021   I,Lyric Barr-McArthur,acting as a Neurosurgeon for Lemont Fillers, NP.,have documented all relevant documentation on the behalf of Lemont Fillers, NP,as directed by  Lemont Fillers, NP while in the presence of Lemont Fillers, NP.    Lemont Fillers, NP

## 2021-07-18 NOTE — Patient Instructions (Signed)
Please begin omeprazole 40mg  once daily. Eat soft foods and small bites.

## 2021-07-18 NOTE — Assessment & Plan Note (Signed)
New.  Recommended dysphagia diet, trial of omeprazole and referral to GI. She is having a significant amount of anxiety surrounding her dysphagia.  If GI work up unrevealing, may consider further evaluation/treatment of anxiety.

## 2021-07-25 ENCOUNTER — Other Ambulatory Visit (HOSPITAL_BASED_OUTPATIENT_CLINIC_OR_DEPARTMENT_OTHER): Payer: Self-pay | Admitting: Family

## 2021-07-25 DIAGNOSIS — Z1231 Encounter for screening mammogram for malignant neoplasm of breast: Secondary | ICD-10-CM

## 2021-07-27 ENCOUNTER — Encounter: Payer: Self-pay | Admitting: Family

## 2021-07-28 ENCOUNTER — Other Ambulatory Visit: Payer: Self-pay | Admitting: Family

## 2021-07-28 MED ORDER — SERTRALINE HCL 50 MG PO TABS: ORAL_TABLET | ORAL | 3 refills | Status: DC

## 2021-08-07 ENCOUNTER — Encounter: Payer: Managed Care, Other (non HMO) | Admitting: Family

## 2021-08-13 ENCOUNTER — Encounter: Payer: Self-pay | Admitting: Family

## 2021-09-11 ENCOUNTER — Encounter (HOSPITAL_BASED_OUTPATIENT_CLINIC_OR_DEPARTMENT_OTHER): Payer: Self-pay

## 2021-09-11 ENCOUNTER — Encounter: Payer: Managed Care, Other (non HMO) | Admitting: Family

## 2021-09-11 ENCOUNTER — Ambulatory Visit (HOSPITAL_BASED_OUTPATIENT_CLINIC_OR_DEPARTMENT_OTHER)
Admission: RE | Admit: 2021-09-11 | Discharge: 2021-09-11 | Disposition: A | Discharge status: Home / Self Care | Payer: Managed Care, Other (non HMO) | Source: Ambulatory Visit | Attending: Family | Admitting: Family

## 2021-09-11 ENCOUNTER — Other Ambulatory Visit: Payer: Self-pay

## 2021-09-11 DIAGNOSIS — Z1231 Encounter for screening mammogram for malignant neoplasm of breast: Secondary | ICD-10-CM | POA: Insufficient documentation

## 2021-09-12 ENCOUNTER — Telehealth: Payer: Self-pay | Admitting: Family

## 2021-09-12 ENCOUNTER — Other Ambulatory Visit: Payer: Self-pay | Admitting: Family

## 2021-09-12 DIAGNOSIS — R928 Other abnormal and inconclusive findings on diagnostic imaging of breast: Secondary | ICD-10-CM

## 2021-09-12 NOTE — Telephone Encounter (Addendum)
Noted abnormal mammogram- diagnostic imaging has been scheduled.

## 2021-09-18 ENCOUNTER — Other Ambulatory Visit: Payer: Self-pay

## 2021-09-18 ENCOUNTER — Encounter: Payer: Self-pay | Admitting: Family

## 2021-09-18 ENCOUNTER — Ambulatory Visit (INDEPENDENT_AMBULATORY_CARE_PROVIDER_SITE_OTHER): Payer: Managed Care, Other (non HMO) | Admitting: Family

## 2021-09-18 VITALS — BP 139/75 | HR 54 | Temp 98.2°F | Resp 16 | Wt 166.0 lb

## 2021-09-18 DIAGNOSIS — R739 Hyperglycemia, unspecified: Secondary | ICD-10-CM | POA: Diagnosis not present

## 2021-09-18 DIAGNOSIS — Z0000 Encounter for general adult medical examination without abnormal findings: Secondary | ICD-10-CM

## 2021-09-18 DIAGNOSIS — E559 Vitamin D deficiency, unspecified: Secondary | ICD-10-CM

## 2021-09-18 LAB — COMPREHENSIVE METABOLIC PANEL
ALT: 22 U/L (ref 0–35)
AST: 20 U/L (ref 0–37)
Albumin: 4.6 g/dL (ref 3.5–5.2)
Alkaline Phosphatase: 43 U/L (ref 39–117)
BUN: 12 mg/dL (ref 6–23)
CO2: 28 mEq/L (ref 19–32)
Calcium: 9.4 mg/dL (ref 8.4–10.5)
Chloride: 104 mEq/L (ref 96–112)
Creatinine, Ser: 0.85 mg/dL (ref 0.40–1.20)
GFR: 82.6 mL/min (ref >60.00)
Glucose, Bld: 100 mg/dL — ABNORMAL HIGH (ref 70–99)
Potassium: 4.3 mEq/L (ref 3.5–5.1)
Sodium: 139 mEq/L (ref 135–145)
Total Bilirubin: 0.7 mg/dL (ref 0.2–1.2)
Total Protein: 6.9 g/dL (ref 6.0–8.3)

## 2021-09-18 LAB — LIPID PANEL
Cholesterol: 194 mg/dL (ref 0–200)
HDL: 84.7 mg/dL (ref >39.00)
LDL Cholesterol: 98 mg/dL (ref 0–99)
NonHDL: 109.03
Total CHOL/HDL Ratio: 2
Triglycerides: 54 mg/dL (ref 0.0–149.0)
VLDL: 10.8 mg/dL (ref 0.0–40.0)

## 2021-09-18 LAB — VITAMIN D 25 HYDROXY (VIT D DEFICIENCY, FRACTURES): VITD: 20.44 ng/mL — ABNORMAL LOW (ref 30.00–100.00)

## 2021-09-18 LAB — TSH: TSH: 1.61 u[IU]/mL (ref 0.35–5.50)

## 2021-09-18 NOTE — Progress Notes (Signed)
Subjective:   By signing my name below, I, Shehryar Baig, attest that this documentation has been prepared under the direction and in the presence of Debbrah Alar NP. 09/18/2021    Patient ID: Nancy Becker, female    DOB: 11/17/1975, 45 y.o.   MRN: [id number]  Chief Complaint  Patient presents with   Annual Exam    HPI Patient is in today for a comprehensive physical exam.   Anxiety- She continues taking 50 Zoloft daily PO and reports no new issues while taking it. She reports having improved in her anxiety while taking it.  CPE She denies having any unexpected weight change, ear pain, hearing loss and rhinorrhea, visual disturbance, cough, chest pain and leg swelling, nausea, vomiting, diarrhea, constipation, blood in stool, or dysuria and frequency, for myalgias and arthralgias, rash, headaches, adenopathy, depression or anxiety at this time. Social history- She has no recent changes to her family medical history. She has no recent surgical procedures. She does not use drugs. She does not drink. She does not use tobacco or vaping products. She currently lives alone at home.  Immunizations: She is not interested in one HIV or hepatitis C screening during her next lab work. She has 2 Covid-19  vaccines at this time and is not interested in receiving anymore.  Diet: She is managing a healthy diet. She reports losing 30 lb's since march.  Exercise: She is participating in regular exercise.  Colonoscopy: Not yet completed. Pap Smear: She reports it was last completed 09/11/2021.  Mammogram: Last completed 09/11/2021. Results showed possible mass in right breast. Repeat US scheduled 10/06/2021.  Dental: She is UTD on dental care. Vision: She is UTD on vision care.    Health Maintenance Due  Topic Date Due   COLONOSCOPY (Pts 45-15yrs Insurance coverage will need to be confirmed)  Never done   PAP SMEAR-Modifier  04/15/2021    Past Medical History:  Diagnosis Date   CIN  I (cervical intraepithelial neoplasia I)    HPV (human papilloma virus) infection    IUD 12/04/2010   Mirena inserted.    Past Surgical History:  Procedure Laterality Date   INTRAUTERINE DEVICE INSERTION  01/04/2016   TYMPANOPLASTY  1987    Family History  Problem Relation Age of Onset   Hypertension Father    Heart disease Maternal Grandmother    Heart disease Maternal Grandfather        heart attack   Hyperthyroidism Maternal Grandfather    Other Maternal Grandfather        Vitamin B deficiency   Hypothyroidism Mother    Other Mother        b12 deficiency   Hyperthyroidism Maternal Aunt    Cancer Paternal Grandfather        lung   Cardiomyopathy Brother        myocarditis, died 96    Social History   Socioeconomic History   Marital status: Single    Spouse name: Not on file   Number of children: 0   Years of education: Not on file   Highest education level: Not on file  Occupational History   Occupation: SOFTWARE Marine scientist    Employer: [employer]  Tobacco Use   Smoking status: Former    Packs/day: 0.50    Years: 20.00    Pack years: 10.00    Types: Cigarettes    Quit date: 09/06/2012    Years since quitting: 9.0   Smokeless tobacco: Never  Substance and Sexual  Activity   Alcohol use: Yes    Alcohol/week: 2.0 standard drinks    Types: 2 Standard drinks or equivalent per week    Comment: occasional   Drug use: No   Sexual activity: Yes    Birth control/protection: I.U.D.    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 4 tablet 0   levonorgestrel (MIRENA) 20 MCG/24HR IUD 1 each by Intrauterine route once. Inserted 12/04/10.     sertraline (ZOLOFT) 50 MG tablet 1/2 tab once daily for 1 week at bedtime, then increase to a full tab once daily at bedtime 30 tablet 3   No facility-administered medications prior to visit.    Allergies  Allergen Reactions   Bee Venom Swelling    Review of Systems  Constitutional:        (-)unexpected weight change (-)Adenopathy  HENT:  Negative for hearing loss.        (-)Rhinorrhea   Eyes:        (-)Visual disturbance  Respiratory:  Negative for cough.   Cardiovascular:  Negative for chest pain and leg swelling.  Gastrointestinal:  Negative for blood in stool, constipation, diarrhea, nausea and vomiting.  Genitourinary:  Negative for dysuria and frequency.  Musculoskeletal:  Negative for joint pain and myalgias.  Skin:  Negative for rash.  Neurological:  Negative for headaches.  Psychiatric/Behavioral:  Negative for depression. The patient is not nervous/anxious.       Objective:    Physical Exam Constitutional:      General: She is not in acute distress.    Appearance: Normal appearance. She is not ill-appearing.  HENT:     Head: Normocephalic and atraumatic.     Right Ear: Ear canal and external ear normal. Tympanic membrane is scarred.     Left Ear: Tympanic membrane, ear canal and external ear normal.  Eyes:     Extraocular Movements: Extraocular movements intact.     Right eye: No nystagmus.     Left eye: No nystagmus.     Pupils: Pupils are equal, round, and reactive to light.  Cardiovascular:     Rate and Rhythm: Normal rate and regular rhythm.     Heart sounds: Normal heart sounds. No murmur heard.   No gallop.  Pulmonary:      Effort: Pulmonary effort is normal. No respiratory distress.     Breath sounds: Normal breath sounds. No wheezing or rales.  Abdominal:     General: There is no distension.     Palpations: Abdomen is soft.     Tenderness: There is no abdominal tenderness. There is no guarding.  Musculoskeletal:     Comments: 5/5 strength in both upper and lower extremities  Skin:    General: Skin is warm and  dry.  Neurological:     Mental Status: She is alert and oriented to person, place, and time.     Deep Tendon Reflexes:     Reflex Scores:      Patellar reflexes are 2+ on the right side and 2+ on the left side. Psychiatric:        Behavior: Behavior normal.        Judgment: Judgment normal.    BP 139/75 (BP Location: Right Arm, Patient Position: Sitting, Cuff Size: Small)   Pulse (!) 54   Temp 98.2 F (36.8 C) (Oral)   Resp 16   Wt 166 lb (75.3 kg)   SpO2 100%   BMI 26.79 kg/m  Wt Readings from Last 3 Encounters:  09/18/21 166 lb (75.3 kg)  07/18/21 166 lb 12.8 oz (75.7 kg)  08/03/20 186 lb (84.4 kg)       Assessment & Plan:   Problem List Items Addressed This Visit       Unprioritized   Vitamin D deficiency   Relevant Orders   Vitamin D (25 hydroxy)   Routine general medical examination at a health care facility    Wt Readings from Last 3 Encounters:  09/18/21 166 lb (75.3 kg)  07/18/21 166 lb 12.8 oz (75.7 kg)  08/03/20 186 lb (84.4 kg)  Continue healthy diet, exercise. Pap and mammogram reportedly up to date.  Will request records from her GYN. GYN also placed referral for colonoscopy.       Other Visit Diagnoses     Hyperglycemia    -  Primary   Relevant Orders   Comp Met (CMET)   Preventative health care       Relevant Orders   Lipid panel   TSH        No orders of the defined types were placed in this encounter.   I, Debbrah Alar NP, personally preformed the services described in this documentation.  All medical record entries made by the scribe  were at my direction and in my presence.  I have reviewed the chart and discharge instructions (if applicable) and agree that the record reflects my personal performance and is accurate and complete. 09/18/2021   I,Shehryar Baig,acting as a Education administrator for Nance Pear, NP.,have documented all relevant documentation on the behalf of Nance Pear, NP,as directed by  Nance Pear, NP while in the presence of Nance Pear, NP.   Nance Pear, NP

## 2021-09-18 NOTE — Assessment & Plan Note (Addendum)
Wt Readings from Last 3 Encounters:  09/18/21 166 lb (75.3 kg)  07/18/21 166 lb 12.8 oz (75.7 kg)  08/03/20 186 lb (84.4 kg)   Continue healthy diet, exercise. Pap and mammogram reportedly up to date.  Will request records from her GYN. GYN also placed referral for colonoscopy. Declines flu shot. Tetanus up to date.

## 2021-09-18 NOTE — Patient Instructions (Signed)
Please complete lab work prior to leaving.   

## 2021-09-19 ENCOUNTER — Telehealth: Payer: Self-pay | Admitting: Family

## 2021-09-19 MED ORDER — VITAMIN D (ERGOCALCIFEROL) 1.25 MG (50000 UNIT) PO CAPS: 50000.0000 [IU] | ORAL_CAPSULE | ORAL | 0 refills | Status: DC

## 2021-09-19 NOTE — Telephone Encounter (Signed)
Patient advised of results and new rx She was scheduled to come back for labs 12-18-21

## 2021-09-19 NOTE — Telephone Encounter (Signed)
All of her lab work is stable except for vit D is low. Advise patient to begin vit D 50000 units once weekly for 12 weeks, then repeat vit D level (dx Vit D deficiency).

## 2021-10-06 ENCOUNTER — Ambulatory Visit
Admission: RE | Admit: 2021-10-06 | Discharge: 2021-10-06 | Disposition: A | Discharge status: Home / Self Care | Payer: Managed Care, Other (non HMO) | Source: Ambulatory Visit | Attending: Family | Admitting: Family

## 2021-10-06 DIAGNOSIS — R928 Other abnormal and inconclusive findings on diagnostic imaging of breast: Secondary | ICD-10-CM

## 2021-11-14 ENCOUNTER — Other Ambulatory Visit: Payer: Self-pay | Admitting: Family

## 2021-11-14 DIAGNOSIS — R1310 Dysphagia, unspecified: Secondary | ICD-10-CM

## 2021-11-26 ENCOUNTER — Other Ambulatory Visit: Payer: Self-pay | Admitting: Family

## 2021-12-10 ENCOUNTER — Other Ambulatory Visit: Payer: Self-pay | Admitting: Family

## 2021-12-14 ENCOUNTER — Other Ambulatory Visit: Payer: Self-pay | Admitting: *Deleted

## 2021-12-14 DIAGNOSIS — E559 Vitamin D deficiency, unspecified: Secondary | ICD-10-CM

## 2021-12-18 ENCOUNTER — Other Ambulatory Visit (INDEPENDENT_AMBULATORY_CARE_PROVIDER_SITE_OTHER): Payer: Managed Care, Other (non HMO)

## 2021-12-18 ENCOUNTER — Other Ambulatory Visit: Payer: Self-pay | Admitting: Family

## 2021-12-18 DIAGNOSIS — E559 Vitamin D deficiency, unspecified: Secondary | ICD-10-CM | POA: Diagnosis not present

## 2021-12-18 LAB — VITAMIN D 25 HYDROXY (VIT D DEFICIENCY, FRACTURES): VITD: 36.26 ng/mL (ref 30.00–100.00)

## 2021-12-18 MED ORDER — VITAMIN D3 75 MCG (3000 UT) PO TABS: 1.0000 | ORAL_TABLET | Freq: Every day | ORAL | Status: AC

## 2022-03-19 ENCOUNTER — Ambulatory Visit: Payer: Managed Care, Other (non HMO) | Admitting: Family

## 2022-09-24 ENCOUNTER — Other Ambulatory Visit (HOSPITAL_BASED_OUTPATIENT_CLINIC_OR_DEPARTMENT_OTHER): Payer: Self-pay | Admitting: Family

## 2022-09-24 DIAGNOSIS — Z1231 Encounter for screening mammogram for malignant neoplasm of breast: Secondary | ICD-10-CM

## 2022-10-08 ENCOUNTER — Ambulatory Visit (HOSPITAL_BASED_OUTPATIENT_CLINIC_OR_DEPARTMENT_OTHER)
Admission: RE | Admit: 2022-10-08 | Discharge: 2022-10-08 | Disposition: A | Discharge status: Home / Self Care | Payer: Managed Care, Other (non HMO) | Source: Ambulatory Visit | Attending: Family | Admitting: Family

## 2022-10-08 ENCOUNTER — Encounter (HOSPITAL_BASED_OUTPATIENT_CLINIC_OR_DEPARTMENT_OTHER): Payer: Self-pay

## 2022-10-08 DIAGNOSIS — Z1231 Encounter for screening mammogram for malignant neoplasm of breast: Secondary | ICD-10-CM | POA: Diagnosis present

## 2023-02-16 IMAGING — MG MM DIGITAL SCREENING BILAT W/ TOMO AND CAD
8 series · 8 of 24 positions shown · non-contrast
Comparison: Previous exam(s).

CLINICAL DATA: Screening.

EXAM:
DIGITAL SCREENING BILATERAL MAMMOGRAM WITH TOMOSYNTHESIS AND CAD
TECHNIQUE: Bilateral screening digital craniocaudal and mediolateral oblique
mammograms were obtained. Bilateral screening digital breast
tomosynthesis was performed. The images were evaluated with
computer-aided detection.

[L CC synth-2D]
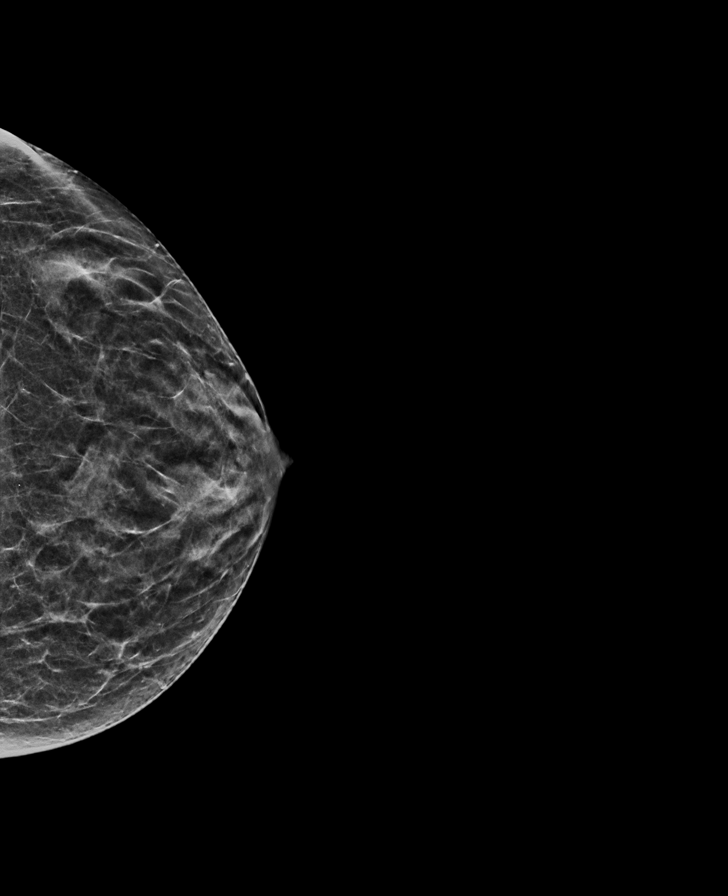

[R CC synth-2D]
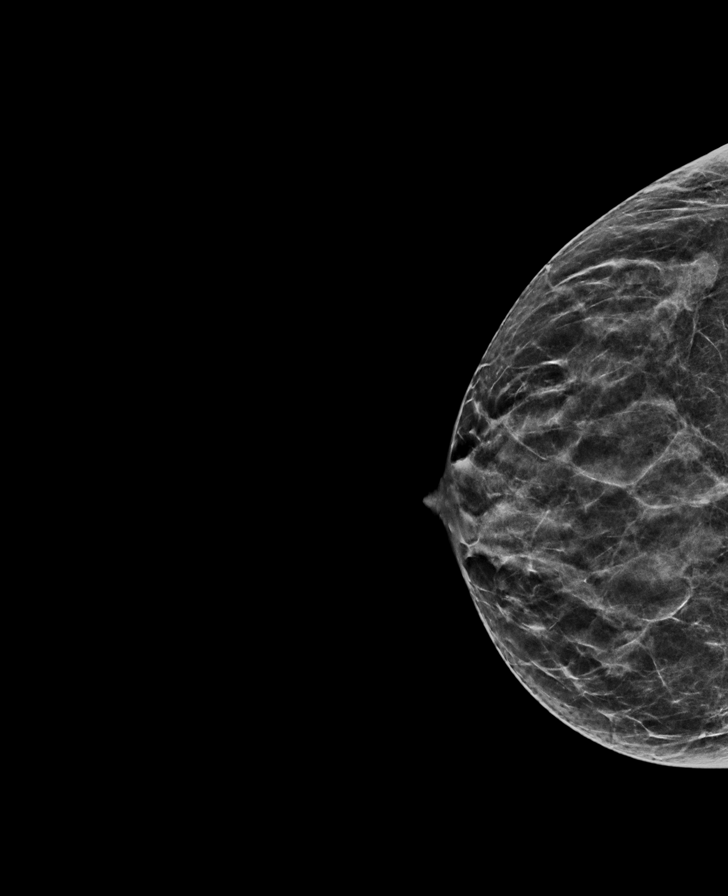

[R MLO synth-2D]
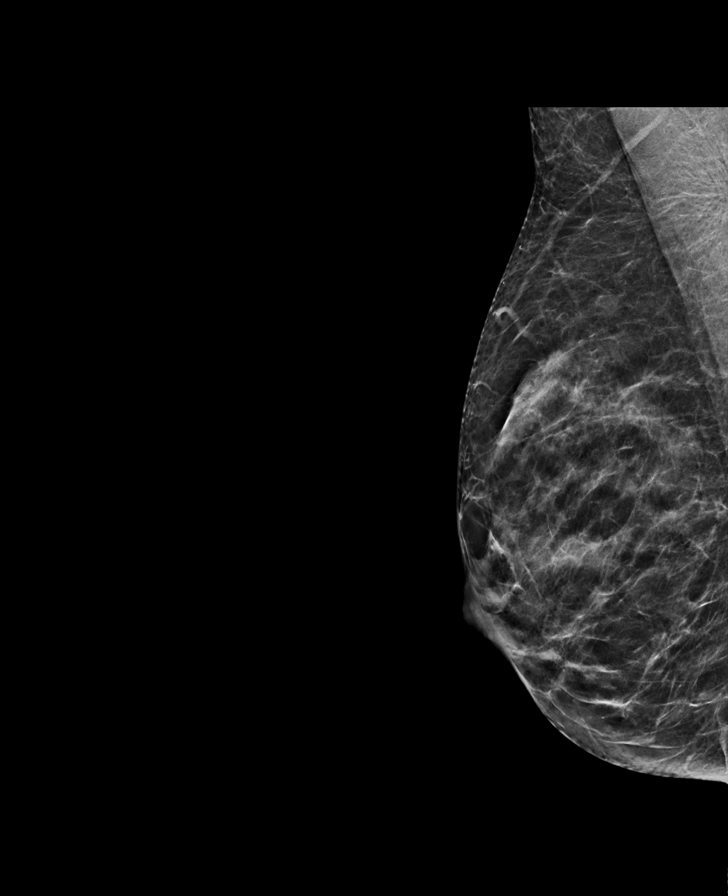

[L MLO synth-2D]
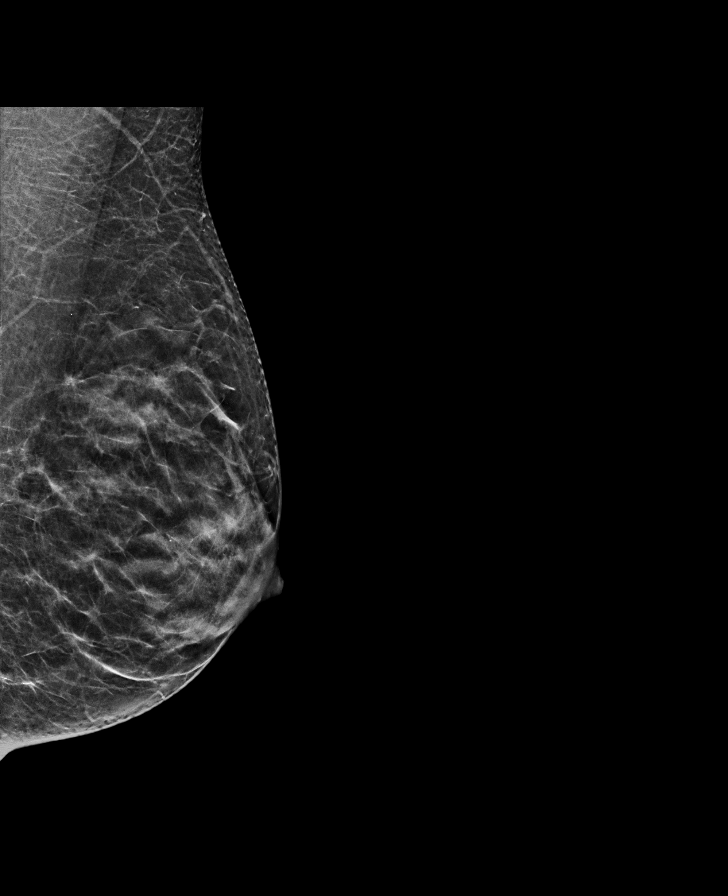

[R MLO tomo · tomo slice 29/56.0]
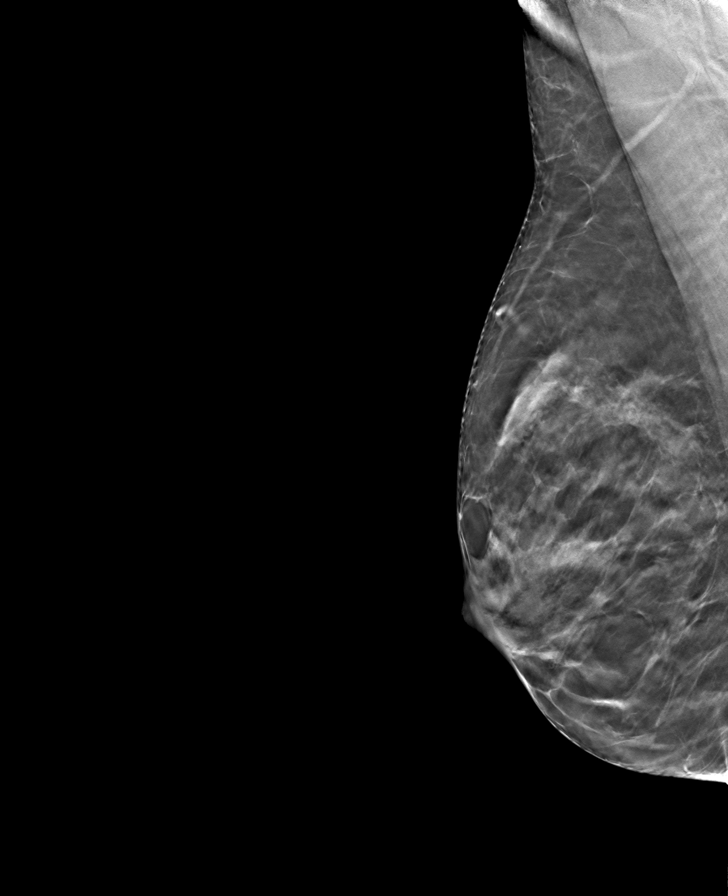

[R CC tomo · tomo slice 28/55.0]
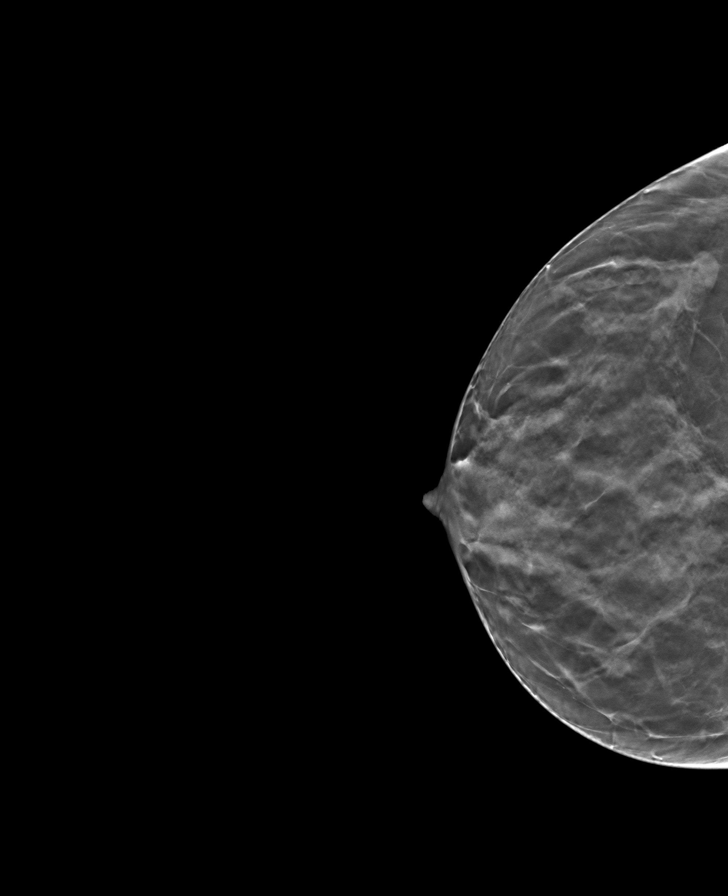

[L MLO tomo · tomo slice 26/51.0]
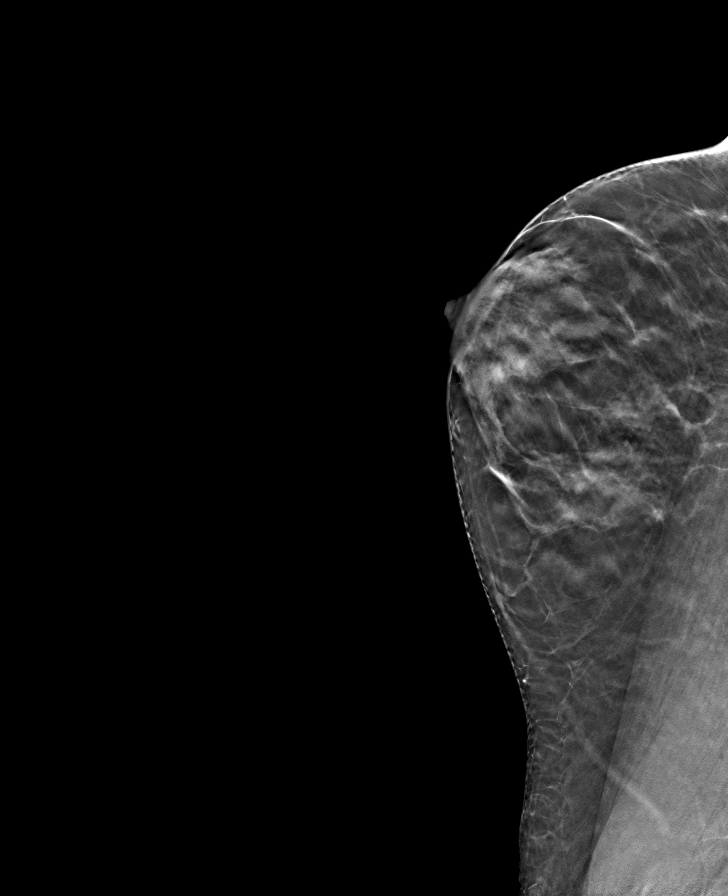

[L CC tomo · tomo slice 27/52.0]
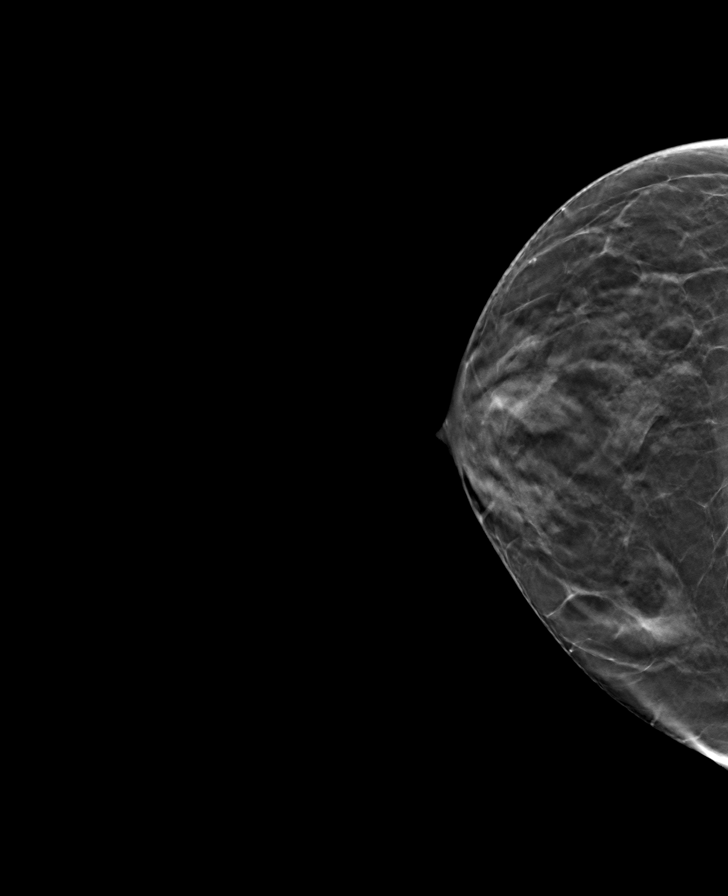

[8 of 24 positions shown; findings below may reference images not displayed]

ACR Breast Density Category c: The breast tissue is heterogeneously
dense, which may obscure small masses.
FINDINGS: In the right breast, a possible mass warrants further evaluation. In
the left breast, no findings suspicious for malignancy.
IMPRESSION: Further evaluation is suggested for a possible mass in the right
breast.

RECOMMENDATION:
Diagnostic mammogram and possibly ultrasound of the right breast.
(Code:82-G-AAO)

The patient will be contacted regarding the findings, and additional
imaging will be scheduled.

BI-RADS CATEGORY  0: Incomplete. Need additional imaging evaluation
and/or prior mammograms for comparison.

## 2023-11-08 ENCOUNTER — Other Ambulatory Visit (HOSPITAL_BASED_OUTPATIENT_CLINIC_OR_DEPARTMENT_OTHER): Payer: Self-pay | Admitting: Family Medicine

## 2023-11-08 ENCOUNTER — Other Ambulatory Visit (HOSPITAL_BASED_OUTPATIENT_CLINIC_OR_DEPARTMENT_OTHER): Payer: Self-pay | Admitting: Family

## 2023-11-08 DIAGNOSIS — Z1231 Encounter for screening mammogram for malignant neoplasm of breast: Secondary | ICD-10-CM

## 2023-12-30 ENCOUNTER — Ambulatory Visit (HOSPITAL_BASED_OUTPATIENT_CLINIC_OR_DEPARTMENT_OTHER)
Admission: RE | Admit: 2023-12-30 | Discharge: 2023-12-30 | Disposition: A | Discharge status: Home / Self Care | Payer: Managed Care, Other (non HMO) | Source: Ambulatory Visit | Attending: Family Medicine | Admitting: Family Medicine

## 2023-12-30 ENCOUNTER — Encounter (HOSPITAL_BASED_OUTPATIENT_CLINIC_OR_DEPARTMENT_OTHER): Payer: Self-pay

## 2023-12-30 DIAGNOSIS — Z1231 Encounter for screening mammogram for malignant neoplasm of breast: Secondary | ICD-10-CM | POA: Diagnosis present
